# Patient Record
Sex: Female | Born: 2000 | Race: Black or African American | Hispanic: No | Marital: Single | Smoking: Current every day smoker
Health system: Southern US, Community
[De-identification: ages and names within clinical notes are randomized; demographics above are authoritative.]

## PROBLEM LIST (undated history)

## (undated) DIAGNOSIS — F419 Anxiety disorder, unspecified: Secondary | ICD-10-CM

## (undated) DIAGNOSIS — R002 Palpitations: Secondary | ICD-10-CM

## (undated) HISTORY — PX: FOOT SURGERY: SHX648

## (undated) HISTORY — DX: Palpitations: R00.2

---
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
## 2020-09-09 ENCOUNTER — Emergency Department (HOSPITAL_BASED_OUTPATIENT_CLINIC_OR_DEPARTMENT_OTHER)
Admission: EM | Admit: 2020-09-09 | Discharge: 2020-09-09 | Disposition: A | Discharge status: Home / Self Care | Payer: No Typology Code available for payment source | Attending: Emergency Medicine | Admitting: Emergency Medicine

## 2020-09-09 ENCOUNTER — Other Ambulatory Visit: Payer: Self-pay

## 2020-09-09 ENCOUNTER — Emergency Department (HOSPITAL_BASED_OUTPATIENT_CLINIC_OR_DEPARTMENT_OTHER)
Admission: RE | Admit: 2020-09-09 | Discharge: 2020-09-09 | Disposition: A | Discharge status: Home / Self Care | Payer: No Typology Code available for payment source | Source: Ambulatory Visit | Attending: Emergency Medicine | Admitting: Emergency Medicine

## 2020-09-09 ENCOUNTER — Encounter (HOSPITAL_BASED_OUTPATIENT_CLINIC_OR_DEPARTMENT_OTHER): Payer: Self-pay

## 2020-09-09 DIAGNOSIS — R102 Pelvic and perineal pain: Secondary | ICD-10-CM | POA: Insufficient documentation

## 2020-09-09 LAB — URINALYSIS, ROUTINE W REFLEX MICROSCOPIC
Bilirubin Urine: NEGATIVE
Glucose, UA: NEGATIVE mg/dL
Hgb urine dipstick: NEGATIVE
Ketones, ur: NEGATIVE mg/dL
Leukocytes,Ua: NEGATIVE
Nitrite: NEGATIVE
Protein, ur: NEGATIVE mg/dL
Specific Gravity, Urine: >1.03 — ABNORMAL HIGH (ref 1.005–1.030)
pH: 5.5 (ref 5.0–8.0)

## 2020-09-09 LAB — WET PREP, GENITAL
Sperm: NONE SEEN
Trich, Wet Prep: NONE SEEN
Yeast Wet Prep HPF POC: NONE SEEN

## 2020-09-09 LAB — PREGNANCY, URINE: Preg Test, Ur: NEGATIVE

## 2020-09-09 MED ORDER — ONDANSETRON 4 MG PO TBDP
8.0000 mg | ORAL_TABLET | Freq: Once | ORAL | Status: AC
  Administered 2020-09-09: 8 mg via ORAL
  Filled 2020-09-09: qty 2

## 2020-09-09 MED ORDER — ONDANSETRON 4 MG PO TBDP: 4.0000 mg | ORAL_TABLET | Freq: Once | ORAL | Status: DC

## 2020-09-09 NOTE — ED Triage Notes (Signed)
C/o lower abd/pelvis pain radiating to back x3 months. (+) nausea (-) vomit/diarrhea (-) dysuria (-) fever/chills

## 2020-09-09 NOTE — ED Provider Notes (Signed)
[facility] Provider Note: Samantha Dell, MD, FACEP  CSN: [id number] MRN: [id number] ARRIVAL: 09/09/20 at 0325 ROOM: [facility]   CHIEF COMPLAINT  Abdominal Pain   HISTORY OF PRESENT ILLNESS  09/09/20 3:50 AM Samantha Buckley is a 20 y.o. female with pelvic pain radiating to her back for the past 3 months.  She rates her pain as a 9 out of 10, stabbing in nature.  It is worse with ambulation.  She denies any vaginal bleeding or discharge.  She denies any dysuria or hematuria.  She denies fever or chills.  She denies vomiting or diarrhea but has had nausea.  She came in tonight because her symptoms worsened recently.   No past medical history on file.  No past surgical history on file.  No family history on file.  Social History   Tobacco Use  . Smoking status: Never Smoker  . Smokeless tobacco: Never Used  Vaping Use  . Vaping Use: Never used  Substance Use Topics  . Alcohol use: Yes  . Drug use: Never    Prior to Admission medications   Not on File    Allergies Penicillins   REVIEW OF SYSTEMS  Negative except as noted here or in the History of Present Illness.   PHYSICAL EXAMINATION  Initial Vital Signs Blood pressure 118/70, pulse 100, temperature 98 F (36.7 C), resp. rate 16, height 5\' 5"  (1.651 m), weight 90.7 kg, last menstrual period 08/20/2020, SpO2 100 %.  Examination General: Well-developed, well-nourished female in no acute distress; appearance consistent with age of record HENT: normocephalic; atraumatic Eyes: Normal appearance Neck: supple Heart: regular rate and rhythm Lungs: clear to auscultation bilaterally Abdomen: soft; nondistended; mild suprapubic tenderness; bowel sounds present GU: Normal external genitalia; no vaginal bleeding; no vaginal discharge; no cervical erythema; enlarged, tender uterus Extremities: No deformity; full range of motion; pulses normal Neurologic: Awake, alert and oriented; motor function intact in  all extremities and symmetric; no facial droop Skin: Warm and dry Psychiatric: Normal mood and affect   RESULTS  Summary of this visit's results, reviewed and interpreted by myself:   EKG Interpretation  Date/Time:    Ventricular Rate:    PR Interval:    QRS Duration:   QT Interval:    QTC Calculation:   R Axis:     Text Interpretation:        Laboratory Studies: Results for orders placed or performed during the hospital encounter of 09/09/20 (from the past 24 hour(s))  Urinalysis, Routine w reflex microscopic     Status: Abnormal   Collection Time: 09/09/20  3:59 AM  Result Value Ref Range   Color, Urine YELLOW YELLOW   APPearance CLEAR CLEAR   Specific Gravity, Urine >1.030 (H) 1.005 - 1.030   pH 5.5 5.0 - 8.0   Glucose, UA NEGATIVE NEGATIVE mg/dL   Hgb urine dipstick NEGATIVE NEGATIVE   Bilirubin Urine NEGATIVE NEGATIVE   Ketones, ur NEGATIVE NEGATIVE mg/dL   Protein, ur NEGATIVE NEGATIVE mg/dL   Nitrite NEGATIVE NEGATIVE   Leukocytes,Ua NEGATIVE NEGATIVE  Pregnancy, urine     Status: None   Collection Time: 09/09/20  3:59 AM  Result Value Ref Range   Preg Test, Ur NEGATIVE NEGATIVE  Wet prep, genital     Status: Abnormal   Collection Time: 09/09/20  3:59 AM   Specimen: Cervical/Vaginal swab; Genital  Result Value Ref Range   Yeast Wet Prep HPF POC NONE SEEN NONE SEEN   Trich, Wet Prep NONE SEEN  NONE SEEN   Clue Cells Wet Prep HPF POC PRESENT (A) NONE SEEN   WBC, Wet Prep HPF POC MANY (A) NONE SEEN   Sperm NONE SEEN    Imaging Studies: No results found.  ED COURSE and MDM  Nursing notes, initial and subsequent vitals signs, including pulse oximetry, reviewed and interpreted by myself.  Vitals:   09/09/20 0333 09/09/20 0334  BP: 118/70   Pulse: 100   Resp: 16   Temp: 98 F (36.7 C)   SpO2: 100%   Weight:  90.7 kg  Height:  5\' 5"  (1.651 m)   Medications  ondansetron (ZOFRAN-ODT) disintegrating tablet 8 mg (8 mg Oral Given 09/09/20 0350)   4:32  AM Enlarged, tender uterus on exam is concerning for uterine fibroids.  We will have patient return for pelvic ultrasound.   PROCEDURES  Procedures   ED DIAGNOSES     ICD-10-CM   1. Pelvic pain in female  R10.2        Jayse Hodkinson, MD 09/09/20 [phone]

## 2020-09-10 LAB — GC/CHLAMYDIA PROBE AMP (~~LOC~~) NOT AT ARMC
Chlamydia: NEGATIVE
Comment: NEGATIVE
Comment: NORMAL
Neisseria Gonorrhea: NEGATIVE

## 2020-12-09 NOTE — Addendum Note (Signed)
Encounter addended by: Novella Olive on: 12/09/2020 10:46 AM  Actions taken: Letter saved

## 2021-01-01 ENCOUNTER — Encounter (HOSPITAL_BASED_OUTPATIENT_CLINIC_OR_DEPARTMENT_OTHER): Payer: Self-pay

## 2021-01-01 ENCOUNTER — Encounter (HOSPITAL_BASED_OUTPATIENT_CLINIC_OR_DEPARTMENT_OTHER): Payer: Self-pay | Admitting: Emergency Medicine

## 2021-01-01 ENCOUNTER — Other Ambulatory Visit: Payer: Self-pay

## 2021-01-01 ENCOUNTER — Other Ambulatory Visit (HOSPITAL_BASED_OUTPATIENT_CLINIC_OR_DEPARTMENT_OTHER): Payer: Self-pay | Admitting: Emergency Medicine

## 2021-01-01 ENCOUNTER — Emergency Department (HOSPITAL_BASED_OUTPATIENT_CLINIC_OR_DEPARTMENT_OTHER)
Admission: EM | Admit: 2021-01-01 | Discharge: 2021-01-01 | Disposition: A | Discharge status: Home / Self Care | Payer: No Typology Code available for payment source | Attending: Emergency Medicine | Admitting: Emergency Medicine

## 2021-01-01 ENCOUNTER — Ambulatory Visit (HOSPITAL_BASED_OUTPATIENT_CLINIC_OR_DEPARTMENT_OTHER): Admit: 2021-01-01 | Payer: No Typology Code available for payment source

## 2021-01-01 DIAGNOSIS — N760 Acute vaginitis: Secondary | ICD-10-CM | POA: Diagnosis not present

## 2021-01-01 DIAGNOSIS — R102 Pelvic and perineal pain: Secondary | ICD-10-CM

## 2021-01-01 DIAGNOSIS — R1030 Lower abdominal pain, unspecified: Secondary | ICD-10-CM | POA: Insufficient documentation

## 2021-01-01 DIAGNOSIS — B9689 Other specified bacterial agents as the cause of diseases classified elsewhere: Secondary | ICD-10-CM | POA: Diagnosis not present

## 2021-01-01 DIAGNOSIS — R110 Nausea: Secondary | ICD-10-CM | POA: Insufficient documentation

## 2021-01-01 LAB — URINALYSIS, ROUTINE W REFLEX MICROSCOPIC
Bilirubin Urine: NEGATIVE
Glucose, UA: NEGATIVE mg/dL
Ketones, ur: NEGATIVE mg/dL
Leukocytes,Ua: NEGATIVE
Nitrite: NEGATIVE
Protein, ur: NEGATIVE mg/dL
Specific Gravity, Urine: 1.015 (ref 1.005–1.030)
pH: 6 (ref 5.0–8.0)

## 2021-01-01 LAB — CBC WITH DIFFERENTIAL/PLATELET
Abs Immature Granulocytes: 0.05 10*3/uL (ref 0.00–0.07)
Basophils Absolute: 0 10*3/uL (ref 0.0–0.1)
Basophils Relative: 0 %
Eosinophils Absolute: 0.2 10*3/uL (ref 0.0–0.5)
Eosinophils Relative: 3 %
HCT: 39.8 % (ref 36.0–46.0)
Hemoglobin: 12.8 g/dL (ref 12.0–15.0)
Immature Granulocytes: 1 %
Lymphocytes Relative: 30 %
Lymphs Abs: 2.7 10*3/uL (ref 0.7–4.0)
MCH: 27.2 pg (ref 26.0–34.0)
MCHC: 32.2 g/dL (ref 30.0–36.0)
MCV: 84.7 fL (ref 80.0–100.0)
Monocytes Absolute: 0.8 10*3/uL (ref 0.1–1.0)
Monocytes Relative: 8 %
Neutro Abs: 5.2 10*3/uL (ref 1.7–7.7)
Neutrophils Relative %: 58 %
Platelets: 247 10*3/uL (ref 150–400)
RBC: 4.7 MIL/uL (ref 3.87–5.11)
RDW: 12.5 % (ref 11.5–15.5)
WBC: 8.9 10*3/uL (ref 4.0–10.5)
nRBC: 0 % (ref 0.0–0.2)

## 2021-01-01 LAB — COMPREHENSIVE METABOLIC PANEL
ALT: 13 U/L (ref 0–44)
AST: 17 U/L (ref 15–41)
Albumin: 4 g/dL (ref 3.5–5.0)
Alkaline Phosphatase: 62 U/L (ref 38–126)
Anion gap: 5 (ref 5–15)
BUN: 12 mg/dL (ref 6–20)
CO2: 28 mmol/L (ref 22–32)
Calcium: 8.9 mg/dL (ref 8.9–10.3)
Chloride: 104 mmol/L (ref 98–111)
Creatinine, Ser: 0.96 mg/dL (ref 0.44–1.00)
GFR, Estimated: >60 mL/min (ref >60)
Glucose, Bld: 86 mg/dL (ref 70–99)
Potassium: 3.2 mmol/L — ABNORMAL LOW (ref 3.5–5.1)
Sodium: 137 mmol/L (ref 135–145)
Total Bilirubin: 0.3 mg/dL (ref 0.3–1.2)
Total Protein: 7.2 g/dL (ref 6.5–8.1)

## 2021-01-01 LAB — PREGNANCY, URINE: Preg Test, Ur: NEGATIVE

## 2021-01-01 LAB — WET PREP, GENITAL
Sperm: NONE SEEN
Trich, Wet Prep: NONE SEEN
Yeast Wet Prep HPF POC: NONE SEEN

## 2021-01-01 LAB — URINALYSIS, MICROSCOPIC (REFLEX)

## 2021-01-01 MED ORDER — ONDANSETRON HCL 4 MG/2ML IJ SOLN
4.0000 mg | Freq: Once | INTRAMUSCULAR | Status: AC
  Administered 2021-01-01: 4 mg via INTRAVENOUS
  Filled 2021-01-01: qty 2

## 2021-01-01 MED ORDER — ONDANSETRON 4 MG PO TBDP: 4.0000 mg | ORAL_TABLET | Freq: Three times a day (TID) | ORAL | 0 refills | Status: DC | PRN

## 2021-01-01 MED ORDER — KETOROLAC TROMETHAMINE 15 MG/ML IJ SOLN
15.0000 mg | Freq: Once | INTRAMUSCULAR | Status: AC
  Administered 2021-01-01: 15 mg via INTRAVENOUS
  Filled 2021-01-01: qty 1

## 2021-01-01 MED ORDER — METRONIDAZOLE 500 MG PO TABS: 500.0000 mg | ORAL_TABLET | Freq: Two times a day (BID) | ORAL | 0 refills | Status: DC

## 2021-01-01 NOTE — ED Triage Notes (Addendum)
Pt presents to ED POV. Pt c/o severe menstrual cramps and nausea. Pt reports that OTC usually do not provide relief. NAD

## 2021-01-01 NOTE — ED Notes (Signed)
Discharge instructions discussed with pt. Pt verbalized understanding. Pt stable and ambulatory.  °

## 2021-01-01 NOTE — ED Provider Notes (Signed)
[facility] EMERGENCY DEPARTMENT Provider Note   CSN: [id number] Arrival date & time: 01/01/21  0059     History Chief Complaint  Patient presents with   Abdominal Cramping    Samantha Buckley is a 20 y.o. female.  The history is provided by the patient.  Abdominal Cramping Samantha Buckley is a 20 y.o. female who presents to the Emergency Department complaining of abdominal cramping.  She presents to the ED complaining of severe pelvic/lower abdominal cramping that started earlier tonight.  Sxs started suddenly and are constant, nonradiating.  Has associated nausea.  No vomiting, diarrhea, dysuria, vaginal discharge.  No prior similar sxs.  Currently on her cycle, started two days ago.  Usually has painful cycles and this is more painful than usual cycle.  No new partners.       History reviewed. No pertinent past medical history.  There are no problems to display for this patient.   History reviewed. No pertinent surgical history.   OB History   No obstetric history on file.     History reviewed. No pertinent family history.  Social History   Tobacco Use   Smoking status: Never   Smokeless tobacco: Never  Vaping Use   Vaping Use: Never used  Substance Use Topics   Alcohol use: Yes   Drug use: Never    Home Medications Prior to Admission medications   Medication Sig Start Date End Date Taking? Authorizing Provider  metroNIDAZOLE (FLAGYL) 500 MG tablet Take 1 tablet (500 mg total) by mouth 2 (two) times daily. 01/01/21  Yes Tilden Fossa, MD  ondansetron (ZOFRAN ODT) 4 MG disintegrating tablet Take 1 tablet (4 mg total) by mouth every 8 (eight) hours as needed for nausea or vomiting. 01/01/21  Yes Tilden Fossa, MD    Allergies    Penicillins  Review of Systems   Review of Systems  All other systems reviewed and are negative.  Physical Exam Updated Vital Signs BP 131/85   Pulse (!) 59   Temp 97.7 F (36.5 C) (Oral)   Resp 18   Ht 5\' 5"   (1.651 m)   Wt 102.5 kg   LMP 12/30/2020   SpO2 100%   BMI 37.61 kg/m   Physical Exam Vitals and nursing note reviewed.  Constitutional:      Appearance: She is well-developed.  HENT:     Head: Normocephalic and atraumatic.  Cardiovascular:     Rate and Rhythm: Normal rate and regular rhythm.     Heart sounds: No murmur heard. Pulmonary:     Effort: Pulmonary effort is normal. No respiratory distress.     Breath sounds: Normal breath sounds.  Abdominal:     Palpations: Abdomen is soft.     Tenderness: There is no abdominal tenderness. There is no guarding or rebound.  Genitourinary:    Comments: Scant vaginal bleeding.  No significant discharge.  No cmt Musculoskeletal:        General: No tenderness.  Skin:    General: Skin is warm and dry.  Neurological:     Mental Status: She is alert and oriented to person, place, and time.  Psychiatric:        Behavior: Behavior normal.    ED Results / Procedures / Treatments   Labs (all labs ordered are listed, but only abnormal results are displayed) Labs Reviewed  WET PREP, GENITAL - Abnormal; Notable for the following components:      Result Value   Clue Cells Wet Prep HPF  POC PRESENT (*)    WBC, Wet Prep HPF POC FEW (*)    All other components within normal limits  COMPREHENSIVE METABOLIC PANEL - Abnormal; Notable for the following components:   Potassium 3.2 (*)    All other components within normal limits  URINALYSIS, ROUTINE W REFLEX MICROSCOPIC - Abnormal; Notable for the following components:   Hgb urine dipstick SMALL (*)    All other components within normal limits  URINALYSIS, MICROSCOPIC (REFLEX) - Abnormal; Notable for the following components:   Bacteria, UA RARE (*)    All other components within normal limits  CBC WITH DIFFERENTIAL/PLATELET  PREGNANCY, URINE  GC/CHLAMYDIA PROBE AMP ([place]) NOT AT [facility]    EKG None  Radiology No results found.  Procedures Procedures   Medications Ordered in  ED Medications  ketorolac (TORADOL) 15 MG/ML injection 15 mg (15 mg Intravenous Given 01/01/21 0220)  ondansetron (ZOFRAN) injection 4 mg (4 mg Intravenous Given 01/01/21 0227)    ED Course  I have reviewed the triage vital signs and the nursing notes.  Pertinent labs & imaging results that were available during my care of the patient were reviewed by me and considered in my medical decision making (see chart for details).    MDM Rules/Calculators/A&P                          patient here for evaluation of pelvic pain that occurred earlier today. She is non-toxic appearing on evaluation. She has mild pelvic tenderness on examination without peritoneal findings. No significant bleeding on evaluation here. Low index of suspicion for torsion, appendicitis. What prep is significant for BV. Given her pain do recommend that she has ultrasound to rule out new ovarian cyst. Doubt to tubo-ovarian abscess, PID. Ultrasound is not currently available at this time. Plan to discharge home with over-the-counter analgesics, Flagyl for BV. Plan to have her return later today for formal pelvic ultrasound to evaluate for cyst. Outpatient follow-up and return precautions discussed.  Final Clinical Impression(s) / ED Diagnoses Final diagnoses:  BV (bacterial vaginosis)  Pelvic pain in female    Rx / DC Orders ED Discharge Orders          Ordered    metroNIDAZOLE (FLAGYL) 500 MG tablet  2 times daily        01/01/21 0357    ondansetron (ZOFRAN ODT) 4 MG disintegrating tablet  Every 8 hours PRN        01/01/21 0357             Tilden Fossa, MD 01/01/21 [phone]

## 2021-01-02 LAB — GC/CHLAMYDIA PROBE AMP (~~LOC~~) NOT AT ARMC
Chlamydia: NEGATIVE
Comment: NEGATIVE
Comment: NORMAL
Neisseria Gonorrhea: NEGATIVE

## 2021-06-04 ENCOUNTER — Emergency Department (HOSPITAL_BASED_OUTPATIENT_CLINIC_OR_DEPARTMENT_OTHER): Payer: No Typology Code available for payment source

## 2021-06-04 ENCOUNTER — Emergency Department (HOSPITAL_BASED_OUTPATIENT_CLINIC_OR_DEPARTMENT_OTHER)
Admission: EM | Admit: 2021-06-04 | Discharge: 2021-06-04 | Disposition: A | Discharge status: Home / Self Care | Payer: No Typology Code available for payment source | Attending: Emergency Medicine | Admitting: Emergency Medicine

## 2021-06-04 ENCOUNTER — Encounter (HOSPITAL_BASED_OUTPATIENT_CLINIC_OR_DEPARTMENT_OTHER): Payer: Self-pay | Admitting: *Deleted

## 2021-06-04 ENCOUNTER — Other Ambulatory Visit: Payer: Self-pay

## 2021-06-04 DIAGNOSIS — R1084 Generalized abdominal pain: Secondary | ICD-10-CM | POA: Diagnosis present

## 2021-06-04 DIAGNOSIS — R112 Nausea with vomiting, unspecified: Secondary | ICD-10-CM | POA: Insufficient documentation

## 2021-06-04 LAB — URINALYSIS, ROUTINE W REFLEX MICROSCOPIC
Bilirubin Urine: NEGATIVE
Glucose, UA: NEGATIVE mg/dL
Ketones, ur: NEGATIVE mg/dL
Leukocytes,Ua: NEGATIVE
Nitrite: NEGATIVE
Protein, ur: NEGATIVE mg/dL
Specific Gravity, Urine: 1.025 (ref 1.005–1.030)
pH: 5.5 (ref 5.0–8.0)

## 2021-06-04 LAB — URINALYSIS, MICROSCOPIC (REFLEX)

## 2021-06-04 LAB — COMPREHENSIVE METABOLIC PANEL
ALT: 16 U/L (ref 0–44)
AST: 17 U/L (ref 15–41)
Albumin: 4.2 g/dL (ref 3.5–5.0)
Alkaline Phosphatase: 64 U/L (ref 38–126)
Anion gap: 10 (ref 5–15)
BUN: 11 mg/dL (ref 6–20)
CO2: 25 mmol/L (ref 22–32)
Calcium: 9.4 mg/dL (ref 8.9–10.3)
Chloride: 99 mmol/L (ref 98–111)
Creatinine, Ser: 0.68 mg/dL (ref 0.44–1.00)
GFR, Estimated: >60 mL/min (ref >60)
Glucose, Bld: 99 mg/dL (ref 70–99)
Potassium: 4.3 mmol/L (ref 3.5–5.1)
Sodium: 134 mmol/L — ABNORMAL LOW (ref 135–145)
Total Bilirubin: 0.5 mg/dL (ref 0.3–1.2)
Total Protein: 7.8 g/dL (ref 6.5–8.1)

## 2021-06-04 LAB — CBC
HCT: 44.7 % (ref 36.0–46.0)
Hemoglobin: 14.1 g/dL (ref 12.0–15.0)
MCH: 26.8 pg (ref 26.0–34.0)
MCHC: 31.5 g/dL (ref 30.0–36.0)
MCV: 84.8 fL (ref 80.0–100.0)
Platelets: 325 10*3/uL (ref 150–400)
RBC: 5.27 MIL/uL — ABNORMAL HIGH (ref 3.87–5.11)
RDW: 13.2 % (ref 11.5–15.5)
WBC: 8.3 10*3/uL (ref 4.0–10.5)
nRBC: 0 % (ref 0.0–0.2)

## 2021-06-04 LAB — LIPASE, BLOOD: Lipase: 39 U/L (ref 11–51)

## 2021-06-04 LAB — PREGNANCY, URINE: Preg Test, Ur: NEGATIVE

## 2021-06-04 MED ORDER — IOHEXOL 300 MG/ML  SOLN
100.0000 mL | Freq: Once | INTRAMUSCULAR | Status: AC | PRN
  Administered 2021-06-04: 100 mL via INTRAVENOUS

## 2021-06-04 MED ORDER — ONDANSETRON 4 MG PO TBDP: 4.0000 mg | ORAL_TABLET | Freq: Three times a day (TID) | ORAL | 0 refills | Status: DC | PRN

## 2021-06-04 MED ORDER — ONDANSETRON 4 MG PO TBDP
8.0000 mg | ORAL_TABLET | Freq: Once | ORAL | Status: AC
  Administered 2021-06-04: 8 mg via ORAL
  Filled 2021-06-04: qty 2

## 2021-06-04 MED ORDER — SODIUM CHLORIDE 0.9 % IV BOLUS
1000.0000 mL | Freq: Once | INTRAVENOUS | Status: AC
  Administered 2021-06-04: 1000 mL via INTRAVENOUS

## 2021-06-04 MED ORDER — MORPHINE SULFATE (PF) 4 MG/ML IV SOLN
4.0000 mg | Freq: Once | INTRAVENOUS | Status: AC
  Administered 2021-06-04: 4 mg via INTRAVENOUS
  Filled 2021-06-04: qty 1

## 2021-06-04 MED ORDER — PANTOPRAZOLE SODIUM 20 MG PO TBEC: 20.0000 mg | DELAYED_RELEASE_TABLET | Freq: Every day | ORAL | 2 refills | Status: AC

## 2021-06-04 MED ORDER — ONDANSETRON HCL 4 MG/2ML IJ SOLN
4.0000 mg | Freq: Once | INTRAMUSCULAR | Status: AC
  Administered 2021-06-04: 4 mg via INTRAVENOUS
  Filled 2021-06-04: qty 2

## 2021-06-04 NOTE — ED Notes (Signed)
Patient transported to CT 

## 2021-06-04 NOTE — ED Notes (Signed)
Pt got nauseous and felt syncopal when sitting on the side of the bed getting dressed. New orders for ODT zofran placed

## 2021-06-04 NOTE — ED Provider Notes (Signed)
[facility] EMERGENCY DEPARTMENT Provider Note   CSN: [id number] Arrival date & time: 06/04/21  1825     History  Chief Complaint  Patient presents with   Abdominal Pain    Samantha DameQuintessa Buckley is a 21 y.o. female.  She is here with worsening of her chronic abdominal pain nausea and vomiting that is been going on for months.  She says it is on and off.  She is seen in the ER and gynecology and "nobody has been able to figure out what the problem is."  She rates the pain as sharp and severe, involves her entire abdomen and causes her to vomit.  She had a little bit of diarrhea today but this is unusual.  No urinary symptoms vaginal bleeding or discharge.  She has no concerns for STIs.  No fevers or chills.  No chest pain.  She does not take any medications.  The history is provided by the patient.  Abdominal Pain Pain location:  Generalized Pain quality: stabbing   Pain severity:  Severe Onset quality:  Gradual Duration:  1 day Timing:  Constant Progression:  Unchanged Chronicity:  Recurrent Context: not trauma   Relieved by:  Nothing Worsened by:  Nothing Ineffective treatments:  None tried Associated symptoms: diarrhea, nausea, shortness of breath (sometimes) and vomiting   Associated symptoms: no chest pain, no constipation, no cough, no dysuria, no fever, no hematemesis, no hematochezia, no hematuria, no sore throat, no vaginal bleeding and no vaginal discharge       Home Medications Prior to Admission medications   Medication Sig Start Date End Date Taking? Authorizing Provider  metroNIDAZOLE (FLAGYL) 500 MG tablet Take 1 tablet (500 mg total) by mouth 2 (two) times daily. 01/01/21   Tilden Fossaees, Elizabeth, MD  ondansetron (ZOFRAN ODT) 4 MG disintegrating tablet Take 1 tablet (4 mg total) by mouth every 8 (eight) hours as needed for nausea or vomiting. 01/01/21   Tilden Fossaees, Elizabeth, MD      Allergies    Penicillins    Review of Systems   Review of Systems  Constitutional:   Negative for fever.  HENT:  Negative for sore throat.   Eyes:  Negative for visual disturbance.  Respiratory:  Positive for shortness of breath (sometimes). Negative for cough.   Cardiovascular:  Negative for chest pain.  Gastrointestinal:  Positive for abdominal pain, diarrhea, nausea and vomiting. Negative for constipation, hematemesis and hematochezia.  Genitourinary:  Negative for dysuria, hematuria, vaginal bleeding and vaginal discharge.  Musculoskeletal:  Negative for back pain.  Skin:  Negative for rash.  Neurological:  Negative for headaches.   Physical Exam Updated Vital Signs BP 132/85 (BP Location: Right Arm)    Pulse 69    Temp 98.5 F (36.9 C) (Oral)    Resp 18    Ht 5\' 5"  (1.651 m)    Wt 102.5 kg    SpO2 100%    BMI 37.60 kg/m  Physical Exam Vitals and nursing note reviewed.  Constitutional:      General: She is not in acute distress.    Appearance: Normal appearance. She is well-developed.  HENT:     Head: Normocephalic and atraumatic.  Eyes:     Conjunctiva/sclera: Conjunctivae normal.  Cardiovascular:     Rate and Rhythm: Normal rate and regular rhythm.     Heart sounds: No murmur heard. Pulmonary:     Effort: Pulmonary effort is normal. No respiratory distress.     Breath sounds: Normal breath sounds.  Abdominal:  Palpations: Abdomen is soft.     Tenderness: There is generalized abdominal tenderness. There is no guarding or rebound.  Musculoskeletal:        General: No swelling.     Cervical back: Neck supple.  Skin:    General: Skin is warm and dry.     Capillary Refill: Capillary refill takes less than 2 seconds.  Neurological:     General: No focal deficit present.     Mental Status: She is alert.    ED Results / Procedures / Treatments   Labs (all labs ordered are listed, but only abnormal results are displayed) Labs Reviewed  COMPREHENSIVE METABOLIC PANEL - Abnormal; Notable for the following components:      Result Value   Sodium 134 (*)     All other components within normal limits  CBC - Abnormal; Notable for the following components:   RBC 5.27 (*)    All other components within normal limits  URINALYSIS, ROUTINE W REFLEX MICROSCOPIC - Abnormal; Notable for the following components:   APPearance HAZY (*)    Hgb urine dipstick SMALL (*)    All other components within normal limits  URINALYSIS, MICROSCOPIC (REFLEX) - Abnormal; Notable for the following components:   Bacteria, UA FEW (*)    All other components within normal limits  LIPASE, BLOOD  PREGNANCY, URINE    EKG None  Radiology CT Abdomen Pelvis W Contrast  Result Date: 06/04/2021 CLINICAL DATA:  Abdominal pain, nausea EXAM: CT ABDOMEN AND PELVIS WITH CONTRAST TECHNIQUE: Multidetector CT imaging of the abdomen and pelvis was performed using the standard protocol following bolus administration of intravenous contrast. RADIATION DOSE REDUCTION: This exam was performed according to the departmental dose-optimization program which includes automated exposure control, adjustment of the mA and/or kV according to patient size and/or use of iterative reconstruction technique. CONTRAST:  139mL OMNIPAQUE IOHEXOL 300 MG/ML  SOLN COMPARISON:  None. FINDINGS: Lower chest: No acute pleural or parenchymal lung disease. Hepatobiliary: No focal liver abnormality is seen. No gallstones, gallbladder wall thickening, or biliary dilatation. Pancreas: Unremarkable. No pancreatic ductal dilatation or surrounding inflammatory changes. Spleen: Normal in size without focal abnormality. Adrenals/Urinary Tract: Adrenal glands are unremarkable. Kidneys are normal, without renal calculi, focal lesion, or hydronephrosis. Bladder is unremarkable. Stomach/Bowel: No bowel obstruction or ileus. Normal appendix right lower quadrant. No bowel wall thickening or inflammatory change. Vascular/Lymphatic: No significant vascular findings are present. No enlarged abdominal or pelvic lymph nodes. Reproductive:  Uterus and bilateral adnexa are unremarkable. Other: No free fluid or free gas.  No abdominal wall hernia. Musculoskeletal: No acute or destructive bony lesions. Reconstructed images demonstrate no additional findings. IMPRESSION: 1. No acute intra-abdominal or intrapelvic process. Electronically Signed   By: Randa Ngo M.D.   On: 06/04/2021 21:20    Procedures Procedures    Medications Ordered in ED Medications  sodium chloride 0.9 % bolus 1,000 mL (0 mLs Intravenous Stopped 06/04/21 2236)  morphine (PF) 4 MG/ML injection 4 mg (4 mg Intravenous Given 06/04/21 2053)  ondansetron (ZOFRAN) injection 4 mg (4 mg Intravenous Given 06/04/21 2047)  iohexol (OMNIPAQUE) 300 MG/ML solution 100 mL (100 mLs Intravenous Contrast Given 06/04/21 2056)  ondansetron (ZOFRAN-ODT) disintegrating tablet 8 mg (8 mg Oral Given 06/04/21 2257)    ED Course/ Medical Decision Making/ A&P Clinical Course as of 06/05/21 0857  Tue Jun 04, 2021  2227 Patient states pain has resolved and nausea is improved.  Reviewed work-up with her.  We will put in a referral  to GI it sounds like this has been a longstanding problem.  We will put on PPI and Zofran. [MB]    Clinical Course User Index [MB] Hayden Rasmussen, MD                           Medical Decision Making Amount and/or Complexity of Data Reviewed Labs: ordered. Radiology: ordered.  Risk Prescription drug management.  This patient complains of generalized sharp abdominal pain, nausea and vomiting diarrhea; this involves an extensive number of treatment Options and is a complaint that carries with it a high risk of complications and Morbidity. The differential includes gastroenteritis, colitis, diverticulitis, gastritis, peptic ulcer disease, obstruction, IBD  I ordered, reviewed and interpreted labs, which included CBC with normal white count normal hemoglobin, chemistries and LFTs normal, urinalysis without clear signs of infection pregnancy test negative I  ordered medication IV fluids pain medication nausea medication with improvement in her symptoms I ordered imaging studies which included CT abdomen and pelvis and I independently    visualized and interpreted imaging which showed no acute findings Additional history obtained from patient's companion Previous records obtained and reviewed in epic, I do not see significant work-up here or in Care Everywhere.  Did have pelvic ultrasound in past.  After the interventions stated above, I reevaluated the patient and found patient's abdomen to remain benign and vitals stable per tensive.  Symptomatically improved.  Will place referral for GI follow-up.  Return instructions discussed no indications for admission at this time          Final Clinical Impression(s) / ED Diagnoses Final diagnoses:  Generalized abdominal pain  Nausea and vomiting, unspecified vomiting type    Rx / DC Orders ED Discharge Orders          Ordered    Ambulatory referral to Gastroenterology        06/04/21 2230    pantoprazole (PROTONIX) 20 MG tablet  Daily        06/04/21 2230    ondansetron (ZOFRAN ODT) 4 MG disintegrating tablet  Every 8 hours PRN        06/04/21 2230              Hayden Rasmussen, MD 06/05/21 [phone]

## 2021-06-04 NOTE — ED Triage Notes (Signed)
Abdominal pain and nausea for months. Has been evaluated for the pain without a cause found.

## 2021-06-04 NOTE — ED Notes (Signed)
Tolerating crackers and water.

## 2021-06-04 NOTE — Discharge Instructions (Signed)
You were seen in the emergency department for recurrent nausea vomiting and abdominal pain.  You had lab work urinalysis and a CAT scan of your abdomen and pelvis that did not show an obvious explanation for your symptoms.  We are putting in a referral to GI for you.  We also prescribing you some acid medication and nausea medication.  Please start with clear liquid diet and advance as tolerated.  Return to the emergency department if any worsening or concerning symptoms

## 2021-06-06 ENCOUNTER — Emergency Department (HOSPITAL_COMMUNITY)
Admission: EM | Admit: 2021-06-06 | Discharge: 2021-06-06 | Disposition: A | Discharge status: Home / Self Care | Payer: No Typology Code available for payment source | Attending: Emergency Medicine | Admitting: Emergency Medicine

## 2021-06-06 DIAGNOSIS — R112 Nausea with vomiting, unspecified: Secondary | ICD-10-CM | POA: Insufficient documentation

## 2021-06-06 DIAGNOSIS — R1030 Lower abdominal pain, unspecified: Secondary | ICD-10-CM | POA: Diagnosis not present

## 2021-06-06 DIAGNOSIS — R197 Diarrhea, unspecified: Secondary | ICD-10-CM | POA: Insufficient documentation

## 2021-06-06 DIAGNOSIS — E876 Hypokalemia: Secondary | ICD-10-CM | POA: Diagnosis not present

## 2021-06-06 LAB — BASIC METABOLIC PANEL
Anion gap: 6 (ref 5–15)
BUN: 8 mg/dL (ref 6–20)
CO2: 26 mmol/L (ref 22–32)
Calcium: 8.9 mg/dL (ref 8.9–10.3)
Chloride: 103 mmol/L (ref 98–111)
Creatinine, Ser: 0.64 mg/dL (ref 0.44–1.00)
GFR, Estimated: >60 mL/min (ref >60)
Glucose, Bld: 101 mg/dL — ABNORMAL HIGH (ref 70–99)
Potassium: 3.3 mmol/L — ABNORMAL LOW (ref 3.5–5.1)
Sodium: 135 mmol/L (ref 135–145)

## 2021-06-06 LAB — URINALYSIS, ROUTINE W REFLEX MICROSCOPIC
Bilirubin Urine: NEGATIVE
Glucose, UA: NEGATIVE mg/dL
Ketones, ur: 5 mg/dL — AB
Leukocytes,Ua: NEGATIVE
Nitrite: NEGATIVE
Protein, ur: NEGATIVE mg/dL
Specific Gravity, Urine: 1.013 (ref 1.005–1.030)
pH: 5 (ref 5.0–8.0)

## 2021-06-06 LAB — HEPATIC FUNCTION PANEL
ALT: 16 U/L (ref 0–44)
AST: 17 U/L (ref 15–41)
Albumin: 3.9 g/dL (ref 3.5–5.0)
Alkaline Phosphatase: 64 U/L (ref 38–126)
Bilirubin, Direct: <0.1 mg/dL (ref 0.0–0.2)
Total Bilirubin: 0.3 mg/dL (ref 0.3–1.2)
Total Protein: 7.4 g/dL (ref 6.5–8.1)

## 2021-06-06 LAB — CBC
HCT: 41.5 % (ref 36.0–46.0)
Hemoglobin: 13.2 g/dL (ref 12.0–15.0)
MCH: 27 pg (ref 26.0–34.0)
MCHC: 31.8 g/dL (ref 30.0–36.0)
MCV: 84.9 fL (ref 80.0–100.0)
Platelets: 285 10*3/uL (ref 150–400)
RBC: 4.89 MIL/uL (ref 3.87–5.11)
RDW: 13.1 % (ref 11.5–15.5)
WBC: 6.1 10*3/uL (ref 4.0–10.5)
nRBC: 0 % (ref 0.0–0.2)

## 2021-06-06 LAB — LIPASE, BLOOD: Lipase: 28 U/L (ref 11–51)

## 2021-06-06 LAB — I-STAT BETA HCG BLOOD, ED (MC, WL, AP ONLY): I-stat hCG, quantitative: <5 m[IU]/mL (ref <5)

## 2021-06-06 MED ORDER — SODIUM CHLORIDE 0.9 % IV BOLUS
1000.0000 mL | Freq: Once | INTRAVENOUS | Status: AC
  Administered 2021-06-06: 1000 mL via INTRAVENOUS

## 2021-06-06 MED ORDER — DICYCLOMINE HCL 10 MG/ML IM SOLN
20.0000 mg | Freq: Once | INTRAMUSCULAR | Status: AC
Start: 2021-06-06 — End: 2021-06-06
  Administered 2021-06-06: 20 mg via INTRAMUSCULAR
  Filled 2021-06-06: qty 2

## 2021-06-06 MED ORDER — ONDANSETRON HCL 4 MG/2ML IJ SOLN
4.0000 mg | Freq: Once | INTRAMUSCULAR | Status: AC
  Administered 2021-06-06: 4 mg via INTRAVENOUS
  Filled 2021-06-06: qty 2

## 2021-06-06 MED ORDER — DICYCLOMINE HCL 20 MG PO TABS: 20.0000 mg | ORAL_TABLET | Freq: Three times a day (TID) | ORAL | 0 refills | Status: DC | PRN

## 2021-06-06 MED ORDER — FAMOTIDINE IN NACL 20-0.9 MG/50ML-% IV SOLN
20.0000 mg | Freq: Once | INTRAVENOUS | Status: AC
  Administered 2021-06-06: 20 mg via INTRAVENOUS
  Filled 2021-06-06: qty 50

## 2021-06-06 NOTE — ED Provider Notes (Signed)
[facility] DEPT Provider Note   CSN: [id number] Arrival date & time: 06/06/21  0208     History  Chief Complaint  Patient presents with   Abdominal Pain    Samantha Buckley is a 21 y.o. female.  Samantha Buckley is a 21 y.o. female who is otherwise healthy, returns to the emergency department for evaluation of abdominal pain, nausea and vomiting.  Patient was seen in the ED 2 days ago for similar symptoms.  Patient reports over the past few months she has had frequent episodes of lower abdominal pain described as sharp and cramping associated with severe nausea and vomiting.  Occasionally patient also has some diarrhea, denies constipation.  Denies any dysuria, urinary frequency.  No vaginal bleeding or vaginal discharge.  Saw her PCP and OB/GYN for the symptoms without clear cause.  Was referred to GI when she was seen 2 days ago.  Reports that she tried taking Zofran and Protonix at home but pain returned and now she has been vomiting.  No prior abdominal surgeries.  The history is provided by the patient and medical records.      Home Medications Prior to Admission medications   Medication Sig Start Date End Date Taking? Authorizing Provider  dicyclomine (BENTYL) 20 MG tablet Take 1 tablet (20 mg total) by mouth 3 (three) times daily as needed (Abdominal pain). 06/06/21  Yes Jacqlyn Larsen, PA-C  metroNIDAZOLE (FLAGYL) 500 MG tablet Take 1 tablet (500 mg total) by mouth 2 (two) times daily. 01/01/21   Quintella Reichert, MD  ondansetron (ZOFRAN ODT) 4 MG disintegrating tablet Take 1 tablet (4 mg total) by mouth every 8 (eight) hours as needed for nausea or vomiting. 06/04/21   Hayden Rasmussen, MD  pantoprazole (PROTONIX) 20 MG tablet Take 1 tablet (20 mg total) by mouth daily. 06/04/21   Hayden Rasmussen, MD      Allergies    Penicillins    Review of Systems   Review of Systems  Constitutional:  Negative for chills and fever.  HENT: Negative.     Respiratory:  Negative for cough and shortness of breath.   Cardiovascular:  Negative for chest pain.  Gastrointestinal:  Positive for abdominal pain, diarrhea, nausea and vomiting.  Genitourinary:  Negative for dysuria, hematuria, vaginal bleeding and vaginal discharge.  Musculoskeletal:  Negative for arthralgias and myalgias.  Skin:  Negative for color change and rash.  Neurological:  Negative for dizziness, syncope and light-headedness.  All other systems reviewed and are negative.  Physical Exam Updated Vital Signs BP 126/87    Pulse 66    Temp 98.3 F (36.8 C) (Oral)    Resp 16    SpO2 100%  Physical Exam Vitals and nursing note reviewed.  Constitutional:      General: She is not in acute distress.    Appearance: Normal appearance. She is well-developed. She is not diaphoretic.  HENT:     Head: Normocephalic and atraumatic.  Eyes:     General:        Right eye: No discharge.        Left eye: No discharge.     Pupils: Pupils are equal, round, and reactive to light.  Cardiovascular:     Rate and Rhythm: Normal rate and regular rhythm.     Pulses: Normal pulses.     Heart sounds: Normal heart sounds.  Pulmonary:     Effort: Pulmonary effort is normal. No respiratory distress.     Breath  sounds: Normal breath sounds. No wheezing or rales.     Comments: Respirations equal and unlabored, patient able to speak in full sentences, lungs clear to auscultation bilaterally  Abdominal:     General: Bowel sounds are normal. There is no distension.     Palpations: Abdomen is soft. There is no mass.     Tenderness: There is no abdominal tenderness. There is no right CVA tenderness, left CVA tenderness or guarding.     Comments: Abdomen soft, nondistended, very mild lower abdominal tenderness that does not localize to one quadrant, no guarding or peritoneal signs  Musculoskeletal:        General: No deformity.     Cervical back: Neck supple.  Skin:    General: Skin is warm and dry.      Capillary Refill: Capillary refill takes less than 2 seconds.  Neurological:     Mental Status: She is alert and oriented to person, place, and time.     Coordination: Coordination normal.     Comments: Speech is clear, able to follow commands Moves extremities without ataxia, coordination intact  Psychiatric:        Mood and Affect: Mood normal.        Behavior: Behavior normal.    ED Results / Procedures / Treatments   Labs (all labs ordered are listed, but only abnormal results are displayed) Labs Reviewed  BASIC METABOLIC PANEL - Abnormal; Notable for the following components:      Result Value   Potassium 3.3 (*)    Glucose, Bld 101 (*)    All other components within normal limits  URINALYSIS, ROUTINE W REFLEX MICROSCOPIC - Abnormal; Notable for the following components:   APPearance HAZY (*)    Hgb urine dipstick SMALL (*)    Ketones, ur 5 (*)    Bacteria, UA MANY (*)    All other components within normal limits  CBC  HEPATIC FUNCTION PANEL  LIPASE, BLOOD  I-STAT BETA HCG BLOOD, ED ([facility], [facility], [facility] ONLY)    EKG None  Radiology CT Abdomen Pelvis W Contrast  Result Date: 06/04/2021 CLINICAL DATA:  Abdominal pain, nausea EXAM: CT ABDOMEN AND PELVIS WITH CONTRAST TECHNIQUE: Multidetector CT imaging of the abdomen and pelvis was performed using the standard protocol following bolus administration of intravenous contrast. RADIATION DOSE REDUCTION: This exam was performed according to the departmental dose-optimization program which includes automated exposure control, adjustment of the mA and/or kV according to patient size and/or use of iterative reconstruction technique. CONTRAST:  135mL OMNIPAQUE IOHEXOL 300 MG/ML  SOLN COMPARISON:  None. FINDINGS: Lower chest: No acute pleural or parenchymal lung disease. Hepatobiliary: No focal liver abnormality is seen. No gallstones, gallbladder wall thickening, or biliary dilatation. Pancreas: Unremarkable. No pancreatic ductal dilatation or  surrounding inflammatory changes. Spleen: Normal in size without focal abnormality. Adrenals/Urinary Tract: Adrenal glands are unremarkable. Kidneys are normal, without renal calculi, focal lesion, or hydronephrosis. Bladder is unremarkable. Stomach/Bowel: No bowel obstruction or ileus. Normal appendix right lower quadrant. No bowel wall thickening or inflammatory change. Vascular/Lymphatic: No significant vascular findings are present. No enlarged abdominal or pelvic lymph nodes. Reproductive: Uterus and bilateral adnexa are unremarkable. Other: No free fluid or free gas.  No abdominal wall hernia. Musculoskeletal: No acute or destructive bony lesions. Reconstructed images demonstrate no additional findings. IMPRESSION: 1. No acute intra-abdominal or intrapelvic process. Electronically Signed   By: Randa Ngo M.D.   On: 06/04/2021 21:20    Procedures Procedures    Medications Ordered  in ED Medications  sodium chloride 0.9 % bolus 1,000 mL (0 mLs Intravenous Stopped 06/06/21 0909)  ondansetron (ZOFRAN) injection 4 mg (4 mg Intravenous Given 06/06/21 0800)  famotidine (PEPCID) IVPB 20 mg premix (0 mg Intravenous Stopped 06/06/21 0833)  dicyclomine (BENTYL) injection 20 mg (20 mg Intramuscular Given 06/06/21 0800)    ED Course/ Medical Decision Making/ A&P                           21 y.o. female presents to the ED with complaints of lower abdominal pain, nausea, vomiting, seen for similar 2 days ago, this involves an extensive number of treatment options, and is a complaint that carries with it a high risk of complications and morbidity.  The differential diagnosis includes IBS, IBD, colitis, diverticulitis, appendicitis, ovarian cyst, PID, endometriosis   On arrival pt is nontoxic, vitals WNL. Exam significant for minimal lower abdominal tenderness, exam otherwise reassuring  Additional history obtained from signifi out cant other at bedside.  Side records obtained and reviewed including ED visit 2  days ago  I ordered IV fluids, Bentyl, Zofran and Pepcid for symptomatic management  Lab Tests:  I Ordered, reviewed, and interpreted labs, which included: No leukocytosis and normal hemoglobin, very mild low potassium, encourage increased dietary intake, no significant electrolyte derangements, normal renal and liver function normal lipase, negative pregnancy, urinalysis with squamous cells and bacteria present but no other signs of infection.    Imaging Studies ordered:  I reviewed CT completed on 2/7, no acute abnormalities noted, given that patient has not had a significant change in symptoms do not feel that repeat imaging is indicated at this time.  ED Course:   Patient's evaluation today has been reassuring, given chronic ongoing pain that has been occurring for months and reassuring blood work do not feel the patient warrants additional work-up at this time.  With Bentyl, fluids, Pepcid and Zofran patient's symptoms have resolved.  Question whether this may be IBS had a referral to GI placed and will follow-up.  Stressed the importance of taking Zofran as soon as you feel nauseated not waiting until vomiting which may be why this medication was not as helpful for her at home.  Discharged home in good condition.    Portions of this note were generated with [employer]. Dictation errors may occur despite best attempts at proofreading.         Final Clinical Impression(s) / ED Diagnoses Final diagnoses:  Lower abdominal pain  Nausea and vomiting, unspecified vomiting type    Rx / DC Orders ED Discharge Orders          Ordered    dicyclomine (BENTYL) 20 MG tablet  3 times daily PRN        06/06/21 1018              Jacqlyn Larsen, [place] 06/06/21 1304    Valarie Merino, MD 06/06/21 [phone]

## 2021-06-06 NOTE — ED Notes (Signed)
Pt provided beverage ?

## 2021-06-06 NOTE — Discharge Instructions (Addendum)
Your work-up has been reassuring.  You may continue to use Zofran as needed for nausea and vomiting, take this as soon as you feel nauseous, take Protonix once daily to help reduce acid production.  You can use Bentyl as needed for abdominal pain and cramping.  Follow-up with GI as planned.  Return for new or worsening symptoms.

## 2021-06-06 NOTE — ED Triage Notes (Signed)
Patient BIB EMS with report of lower abdominal pain ongoing for months. Pt endorses N/V, denies fever or chills.

## 2021-08-25 ENCOUNTER — Other Ambulatory Visit: Payer: Self-pay

## 2021-08-25 ENCOUNTER — Ambulatory Visit (HOSPITAL_BASED_OUTPATIENT_CLINIC_OR_DEPARTMENT_OTHER): Admit: 2021-08-25 | Payer: No Typology Code available for payment source

## 2021-08-25 ENCOUNTER — Emergency Department (HOSPITAL_BASED_OUTPATIENT_CLINIC_OR_DEPARTMENT_OTHER)
Admission: EM | Admit: 2021-08-25 | Discharge: 2021-08-25 | Disposition: A | Discharge status: Home / Self Care | Payer: No Typology Code available for payment source | Attending: Emergency Medicine | Admitting: Emergency Medicine

## 2021-08-25 ENCOUNTER — Encounter (HOSPITAL_BASED_OUTPATIENT_CLINIC_OR_DEPARTMENT_OTHER): Payer: Self-pay | Admitting: Emergency Medicine

## 2021-08-25 DIAGNOSIS — R110 Nausea: Secondary | ICD-10-CM | POA: Diagnosis not present

## 2021-08-25 DIAGNOSIS — R102 Pelvic and perineal pain: Secondary | ICD-10-CM | POA: Diagnosis present

## 2021-08-25 LAB — URINALYSIS, ROUTINE W REFLEX MICROSCOPIC
Bilirubin Urine: NEGATIVE
Glucose, UA: NEGATIVE mg/dL
Hgb urine dipstick: NEGATIVE
Ketones, ur: NEGATIVE mg/dL
Leukocytes,Ua: NEGATIVE
Nitrite: NEGATIVE
Protein, ur: NEGATIVE mg/dL
Specific Gravity, Urine: 1.01 (ref 1.005–1.030)
pH: 6.5 (ref 5.0–8.0)

## 2021-08-25 LAB — WET PREP, GENITAL
Sperm: NONE SEEN
Trich, Wet Prep: NONE SEEN
WBC, Wet Prep HPF POC: <10 (ref <10)
Yeast Wet Prep HPF POC: NONE SEEN

## 2021-08-25 LAB — PREGNANCY, URINE: Preg Test, Ur: NEGATIVE

## 2021-08-25 MED ORDER — HYDROCODONE-ACETAMINOPHEN 5-325 MG PO TABS
1.0000 | ORAL_TABLET | Freq: Once | ORAL | Status: AC
  Administered 2021-08-25: 1 via ORAL
  Filled 2021-08-25: qty 1

## 2021-08-25 MED ORDER — ONDANSETRON 4 MG PO TBDP
8.0000 mg | ORAL_TABLET | Freq: Once | ORAL | Status: AC
  Administered 2021-08-25: 8 mg via ORAL
  Filled 2021-08-25: qty 2

## 2021-08-25 MED ORDER — ONDANSETRON 8 MG PO TBDP: 8.0000 mg | ORAL_TABLET | Freq: Three times a day (TID) | ORAL | 1 refills | Status: DC | PRN

## 2021-08-25 NOTE — ED Triage Notes (Signed)
Reports lower abdominal pain that radiates into her lower back.  Reports hx of the same on and off for years.  Also reports some nausea.  ?

## 2021-08-25 NOTE — ED Provider Notes (Signed)
? ?[facility] ?Provider Note: [name], MD, FACEP ? ?CSN: [id number] ?MRN: [id number] ?ARRIVAL: 08/25/21 at 0346 ?ROOM: MH05/[facility]05 ? ? ?CHIEF COMPLAINT  ?Abdominal Pain ? ? ?HISTORY OF PRESENT ILLNESS  ?08/25/21 4:02 AM ?Samantha Buckley is a 21 y.o. female with a history of intermittent pelvic pain for which she has been seen numerous times.  She is here with lower abdominal pain that began about midnight.  Nothing precipitated this pain.  She rates it as an 8 out of 10 and it radiates into her lower back.  It is aching and sharp and somewhat worse with movement or palpation.  She is having nausea with it but no vomiting, diarrhea, vaginal bleeding, vaginal discharge, dysuria or hematuria.  Her last menstrual period in about 2 weeks ago. ? ? ?History reviewed. No pertinent past medical history. ? ?History reviewed. No pertinent surgical history. ? ?No family history on file. ? ?Social History  ? ?Tobacco Use  ? Smoking status: Never  ? Smokeless tobacco: Never  ?Vaping Use  ? Vaping Use: Every day  ?Substance Use Topics  ? Alcohol use: Yes  ? Drug use: Never  ? ? ?Prior to Admission medications   ?Medication Sig Start Date End Date Taking? Authorizing Provider  ?dicyclomine (BENTYL) 20 MG tablet Take 1 tablet (20 mg total) by mouth 3 (three) times daily as needed (Abdominal pain). 06/06/21   Dartha Lodge, PA-C  ?ondansetron (ZOFRAN ODT) 4 MG disintegrating tablet Take 1 tablet (4 mg total) by mouth every 8 (eight) hours as needed for nausea or vomiting. 06/04/21   Terrilee Files, MD  ?pantoprazole (PROTONIX) 20 MG tablet Take 1 tablet (20 mg total) by mouth daily. 06/04/21   Terrilee Files, MD  ? ? ?Allergies ?Penicillins ? ? ?REVIEW OF SYSTEMS  ?Negative except as noted here or in the History of Present Illness. ? ? ?PHYSICAL EXAMINATION  ?Initial Vital Signs ?Blood pressure 109/64, pulse 99, temperature 98.2 ?F (36.8 ?C), temperature source Oral, resp. rate 18, height 5\' 5"  (1.651 m), weight  102.5 kg, last menstrual period 07/29/2021, SpO2 100 %. ? ?Examination ?General: Well-developed, well-nourished female in no acute distress; appearance consistent with age of record ?HENT: normocephalic; atraumatic ?Eyes: Normal appearance ?Neck: supple ?Heart: regular rate and rhythm ?Lungs: clear to auscultation bilaterally ?Abdomen: soft; nondistended; mild suprapubic tenderness; bowel sounds present ?GU: Normal external genitalia; mucoid vaginal discharge; no vaginal bleeding; no cervical motion tenderness; bilateral adnexal tenderness ?Extremities: No deformity; full range of motion; pulses normal ?Neurologic: Awake, alert and oriented; motor function intact in all extremities and symmetric; no facial droop ?Skin: Warm and dry ?Psychiatric: Normal mood and affect ? ? ?RESULTS  ?Summary of this visit's results, reviewed and interpreted by myself: ? ? EKG Interpretation ? ?Date/Time:    ?Ventricular Rate:    ?PR Interval:    ?QRS Duration:   ?QT Interval:    ?QTC Calculation:   ?R Axis:     ?Text Interpretation:   ?  ? ?  ? ?Laboratory Studies: ?Results for orders placed or performed during the hospital encounter of 08/25/21 (from the past 24 hour(s))  ?Wet prep, genital     Status: Abnormal  ? Collection Time: 08/25/21  4:09 AM  ? Specimen: Cervix  ?Result Value Ref Range  ? Yeast Wet Prep HPF POC NONE SEEN NONE SEEN  ? Trich, Wet Prep NONE SEEN NONE SEEN  ? Clue Cells Wet Prep HPF POC PRESENT (A) NONE SEEN  ? WBC,  Wet Prep HPF POC <10 <10  ? Sperm NONE SEEN   ?Urinalysis, Routine w reflex microscopic Urine, Clean Catch     Status: None  ? Collection Time: 08/25/21  5:30 AM  ?Result Value Ref Range  ? Color, Urine YELLOW YELLOW  ? APPearance CLEAR CLEAR  ? Specific Gravity, Urine 1.010 1.005 - 1.030  ? pH 6.5 5.0 - 8.0  ? Glucose, UA NEGATIVE NEGATIVE mg/dL  ? Hgb urine dipstick NEGATIVE NEGATIVE  ? Bilirubin Urine NEGATIVE NEGATIVE  ? Ketones, ur NEGATIVE NEGATIVE mg/dL  ? Protein, ur NEGATIVE NEGATIVE mg/dL  ?  Nitrite NEGATIVE NEGATIVE  ? Leukocytes,Ua NEGATIVE NEGATIVE  ?Pregnancy, urine     Status: None  ? Collection Time: 08/25/21  5:30 AM  ?Result Value Ref Range  ? Preg Test, Ur NEGATIVE NEGATIVE  ? ?Imaging Studies: ?No results found. ? ?ED COURSE and MDM  ?Nursing notes, initial and subsequent vitals signs, including pulse oximetry, reviewed and interpreted by myself. ? ?Vitals:  ? 08/25/21 0347 08/25/21 0348  ?BP: 109/64   ?Pulse: 99   ?Resp: 18   ?Temp: 98.2 ?F (36.8 ?C)   ?TempSrc: Oral   ?SpO2: 100%   ?Weight:  102.5 kg  ?Height:  5\' 5"  (1.651 m)  ? ?Medications  ?HYDROcodone-acetaminophen (NORCO/VICODIN) 5-325 MG per tablet 1 tablet (has no administration in time range)  ?ondansetron (ZOFRAN-ODT) disintegrating tablet 8 mg (8 mg Oral Given 08/25/21 0434)  ? ?The cause of the patient's pelvic pain is unclear.  It may have a chronic etiology such as endometriosis.  Since it is somewhat midway between her cycles it could represent mittelschmerz.  We will have her return later this morning for a pelvic ultrasound. ? ? ?PROCEDURES  ?Procedures ? ? ?ED DIAGNOSES  ? ?  ICD-10-CM   ?1. Pelvic pain in female  R10.2   ?  ? ? ? ?  ?08/27/21, MD ?08/25/21 0542 ? ?

## 2021-08-27 LAB — GC/CHLAMYDIA PROBE AMP (~~LOC~~) NOT AT ARMC
Chlamydia: NEGATIVE
Comment: NEGATIVE
Comment: NORMAL
Neisseria Gonorrhea: NEGATIVE

## 2021-09-05 ENCOUNTER — Emergency Department (HOSPITAL_BASED_OUTPATIENT_CLINIC_OR_DEPARTMENT_OTHER): Payer: No Typology Code available for payment source | Admitting: Radiology

## 2021-09-05 ENCOUNTER — Encounter (HOSPITAL_BASED_OUTPATIENT_CLINIC_OR_DEPARTMENT_OTHER): Payer: Self-pay | Admitting: Obstetrics and Gynecology

## 2021-09-05 ENCOUNTER — Other Ambulatory Visit: Payer: Self-pay

## 2021-09-05 ENCOUNTER — Emergency Department (HOSPITAL_BASED_OUTPATIENT_CLINIC_OR_DEPARTMENT_OTHER)
Admission: EM | Admit: 2021-09-05 | Discharge: 2021-09-05 | Disposition: A | Discharge status: Home / Self Care | Payer: No Typology Code available for payment source | Attending: Emergency Medicine | Admitting: Emergency Medicine

## 2021-09-05 DIAGNOSIS — S92352A Displaced fracture of fifth metatarsal bone, left foot, initial encounter for closed fracture: Secondary | ICD-10-CM | POA: Diagnosis not present

## 2021-09-05 DIAGNOSIS — X501XXA Overexertion from prolonged static or awkward postures, initial encounter: Secondary | ICD-10-CM | POA: Diagnosis not present

## 2021-09-05 DIAGNOSIS — Y990 Civilian activity done for income or pay: Secondary | ICD-10-CM | POA: Insufficient documentation

## 2021-09-05 DIAGNOSIS — S99922A Unspecified injury of left foot, initial encounter: Secondary | ICD-10-CM | POA: Diagnosis present

## 2021-09-05 NOTE — ED Provider Notes (Signed)
?[facility] EM[facility]GENCY DEPT ?Provider Note ? ? ?CSN: [id number] ?Arrival date & time: 09/05/21  1545 ? ?  ? ?History ? ?Chief Complaint  ?Patient presents with  ? Fall  ? Ankle Pain  ? ? ?Samantha Buckley is a 21 y.o. female presents to the ED for evaluation of a left foot and ankle injury that occurred at work.  Patient was wearing high heels and fell, rolling her left ankle.  She heard a crack and has been unable to ambulate since then.  She denies numbness and tingling.  No treatment prior to arrival as she presented immediately to the emergency department.  She has no other systemic complaints. ? ? ?Fall ? ?Ankle Pain ? ?  ? ?Home Medications ?Prior to Admission medications   ?Medication Sig Start Date End Date Taking? Authorizing Provider  ?dicyclomine (BENTYL) 20 MG tablet Take 1 tablet (20 mg total) by mouth 3 (three) times daily as needed (Abdominal pain). 06/06/21   Dartha Lodge, PA-C  ?ondansetron (ZOFRAN-ODT) 8 MG disintegrating tablet Take 1 tablet (8 mg total) by mouth every 8 (eight) hours as needed. 08/25/21   Molpus, John, MD  ?pantoprazole (PROTONIX) 20 MG tablet Take 1 tablet (20 mg total) by mouth daily. 06/04/21   Terrilee Files, MD  ?   ? ?Allergies    ?Penicillins   ? ?Review of Systems   ?Review of Systems ? ?Physical Exam ?Updated Vital Signs ?BP (!) 141/88 (BP Location: Left Arm)   Pulse 79   Temp 99 ?F (37.2 ?C)   Resp 18   Ht 5\' 6"  (1.676 m)   Wt 108.9 kg   LMP 09/01/2021 (Approximate)   SpO2 100%   BMI 38.74 kg/m?  ?Physical Exam ?Vitals and nursing note reviewed.  ?Constitutional:   ?   General: She is not in acute distress. ?   Appearance: She is not ill-appearing.  ?HENT:  ?   Head: Atraumatic.  ?Eyes:  ?   Conjunctiva/sclera: Conjunctivae normal.  ?Cardiovascular:  ?   Rate and Rhythm: Normal rate and regular rhythm.  ?   Pulses: Normal pulses.  ?   Heart sounds: No murmur heard. ?Pulmonary:  ?   Effort: Pulmonary effort is normal. No respiratory distress.  ?    Breath sounds: Normal breath sounds.  ?Abdominal:  ?   General: Abdomen is flat. There is no distension.  ?   Palpations: Abdomen is soft.  ?   Tenderness: There is no abdominal tenderness.  ?Musculoskeletal:     ?   General: Normal range of motion.  ?   Cervical back: Normal range of motion.  ?     Feet: ? ?Feet:  ?   Comments: Moderate swelling and tenderness over the proximal fifth metatarsal.  2+ DP pulses.  Patient is able to wiggle her toes. ?Skin: ?   General: Skin is warm and dry.  ?   Capillary Refill: Capillary refill takes less than 2 seconds.  ?Neurological:  ?   General: No focal deficit present.  ?   Mental Status: She is alert.  ?Psychiatric:     ?   Mood and Affect: Mood normal.  ? ? ?ED Results / Procedures / Treatments   ?Labs ?(all labs ordered are listed, but only abnormal results are displayed) ?Labs Reviewed - No data to display ? ?EKG ?None ? ?Radiology ?DG Ankle Complete Left ? ?Result Date: 09/05/2021 ?CLINICAL DATA:  Fall.  Rolled left ankle. EXAM: LEFT ANKLE COMPLETE - 3+  VIEW; LEFT FOOT - COMPLETE 3+ VIEW COMPARISON:  None Available. FINDINGS: Left ankle: The ankle mortise is symmetric and intact. Joint spaces preserved. No acute fracture is seen. No dislocation. Left foot: There is a displaced fracture of the lateral base of fifth metatarsal with approximately 5 mm lateral and 5 mm proximal displacement of the smaller fracture component. No dislocation. IMPRESSION: Displaced, intra-articular oblique acute fracture of the base of the fifth metatarsal. Electronically Signed   By: Neita Garnet M.D.   On: 09/05/2021 16:17  ? ?DG Foot Complete Left ? ?Result Date: 09/05/2021 ?CLINICAL DATA:  Fall.  Rolled left ankle. EXAM: LEFT ANKLE COMPLETE - 3+ VIEW; LEFT FOOT - COMPLETE 3+ VIEW COMPARISON:  None Available. FINDINGS: Left ankle: The ankle mortise is symmetric and intact. Joint spaces preserved. No acute fracture is seen. No dislocation. Left foot: There is a displaced fracture of the  lateral base of fifth metatarsal with approximately 5 mm lateral and 5 mm proximal displacement of the smaller fracture component. No dislocation. IMPRESSION: Displaced, intra-articular oblique acute fracture of the base of the fifth metatarsal. Electronically Signed   By: Neita Garnet M.D.   On: 09/05/2021 16:17   ? ?Procedures ?Procedures  ? ? ?Medications Ordered in ED ?Medications - No data to display ? ?ED Course/ Medical Decision Making/ A&P ?  ?                        ?Medical Decision Making ? ?21 year old female in no acute distress, nontoxic-appearing presents to the ED for evaluation of a left foot injury.  Vitals without significant abnormality.  Patient has moderate swelling, tenderness to the proximal lateral fifth metatarsal.  Possible deformity.  I ordered and interpreted x-ray which shows displaced, intra-articular fracture of the base of the fifth metatarsal.  I agree with radiologist interpretation.  I consulted with Dr. Susa Simmonds with orthopedics to discuss whether patient should be weightbearing or nonweightbearing.  Given its location, he notes that patient can be weightbearing and should follow-up with him in his office either tomorrow or early next week.  Patient given boot and crutches here in the emergency department.  RICE protocol recommended.  Return precautions discussed.  Discharged home in good condition. ?Final Clinical Impression(s) / ED Diagnoses ?Final diagnoses:  ?Closed displaced fracture of fifth metatarsal bone of left foot, initial encounter  ? ? ?Rx / DC Orders ?ED Discharge Orders   ? ? None  ? ?  ? ? ?  ?Janell Quiet, [place] ?09/05/21 2231 ? ?  ?Virgina Norfolk, DO ?09/05/21 2315 ? ?

## 2021-09-05 NOTE — ED Triage Notes (Signed)
Pt BIB [facility] from work. Pt reports she fell and rolled her Left ankle. Pt unable to move her ankle d/t pain, pulses present, swelling noted, unable to apply any weight to her LLE. ? ?BP 124/84 ?HR 80 ?RR 18 ?99% RA  ?

## 2021-09-05 NOTE — Discharge Instructions (Addendum)
You fractured one of the bones in your left foot.  I given you a boot here and some crutches should you need it, however if you need to bear weight on it, that is fine.  I consulted with our orthopedic doctor who would like to have you follow-up in his office either tomorrow or early next week.  His office number is provided above, please schedule that appointment at your earliest convenience.  When you are at home, elevate the leg on some pillows when you are on the couch or in bed, ice on ice off 20 minutes at a time.  Take Tylenol or Motrin for pain. ?

## 2021-10-16 ENCOUNTER — Emergency Department (HOSPITAL_BASED_OUTPATIENT_CLINIC_OR_DEPARTMENT_OTHER)
Admission: EM | Admit: 2021-10-16 | Discharge: 2021-10-16 | Disposition: A | Discharge status: Home / Self Care | Payer: No Typology Code available for payment source | Attending: Emergency Medicine | Admitting: Emergency Medicine

## 2021-10-16 ENCOUNTER — Emergency Department (HOSPITAL_BASED_OUTPATIENT_CLINIC_OR_DEPARTMENT_OTHER): Payer: No Typology Code available for payment source

## 2021-10-16 ENCOUNTER — Encounter (HOSPITAL_BASED_OUTPATIENT_CLINIC_OR_DEPARTMENT_OTHER): Payer: Self-pay | Admitting: Emergency Medicine

## 2021-10-16 DIAGNOSIS — M79644 Pain in right finger(s): Secondary | ICD-10-CM | POA: Diagnosis not present

## 2021-10-16 DIAGNOSIS — L609 Nail disorder, unspecified: Secondary | ICD-10-CM | POA: Diagnosis not present

## 2021-10-16 NOTE — ED Provider Notes (Signed)
[facility] EMERGENCY DEPARTMENT Provider Note   CSN: [id number] Arrival date & time: 10/16/21  1240     History  Chief Complaint  Patient presents with   Nail Problem    Samantha Buckley is a 21 y.o. female.  HPI  Patient with no contributable medical history presents today due to right thumb pain.  This started acutely yesterday, patient states she was sitting on the porch when she pushed herself from sitting to standing and started having acute pain in the right thumb.  She states at the time her acrylic nail removed, did not fully detach and was able to come partially off and on.  Today the nail is intact, denies any fracture dislocation.  She is having pain to the thumb which is worse with flexion or extension or abduction of the thumb.  Denies any fevers or chills, no wrist pain.  Home Medications Prior to Admission medications   Medication Sig Start Date End Date Taking? Authorizing Provider  dicyclomine (BENTYL) 20 MG tablet Take 1 tablet (20 mg total) by mouth 3 (three) times daily as needed (Abdominal pain). 06/06/21   Dartha Lodge, PA-C  ondansetron (ZOFRAN-ODT) 8 MG disintegrating tablet Take 1 tablet (8 mg total) by mouth every 8 (eight) hours as needed. 08/25/21   Molpus, John, MD  pantoprazole (PROTONIX) 20 MG tablet Take 1 tablet (20 mg total) by mouth daily. 06/04/21   Terrilee Files, MD      Allergies    Penicillins    Review of Systems   Review of Systems  Physical Exam Updated Vital Signs BP 124/84   Pulse 91   Temp 98.6 F (37 C) (Oral)   Resp 20   Ht 5\' 5"  (1.651 m)   Wt 113.4 kg   LMP 09/26/2021   SpO2 100%   BMI 41.60 kg/m  Physical Exam Vitals and nursing note reviewed. Exam conducted with a chaperone present.  Constitutional:      General: She is not in acute distress.    Appearance: Normal appearance.  HENT:     Head: Normocephalic and atraumatic.  Eyes:     General: No scleral icterus.    Extraocular Movements: Extraocular  movements intact.     Pupils: Pupils are equal, round, and reactive to light.  Cardiovascular:     Pulses: Normal pulses.  Musculoskeletal:        General: Tenderness present.     Comments: ROM is intact to the right nail but does do subjective tenderness with flexion or extension.  The acrylic nail is not removable, there is some crusted blood underneath the nail but no obvious laceration.  No crepitus  Skin:    Capillary Refill: Capillary refill takes less than 2 seconds.     Coloration: Skin is not jaundiced.  Neurological:     Mental Status: She is alert. Mental status is at baseline.     Coordination: Coordination normal.     Comments: sensation to light touch grossly intact      ED Results / Procedures / Treatments   Labs (all labs ordered are listed, but only abnormal results are displayed) Labs Reviewed - No data to display  EKG None  Radiology DG Finger Thumb Right  Result Date: 10/16/2021 CLINICAL DATA:  Fall. EXAM: RIGHT THUMB 2+V COMPARISON:  None Available. FINDINGS: There is no evidence of fracture or dislocation. There is no evidence of arthropathy or other focal bone abnormality. Soft tissues are unremarkable. IMPRESSION: No acute osseous  abnormality. Electronically Signed   By: Maudry Mayhew M.D.   On: 10/16/2021 14:01    Procedures Procedures    Medications Ordered in ED Medications - No data to display  ED Course/ Medical Decision Making/ A&P                           Medical Decision Making Amount and/or Complexity of Data Reviewed Radiology: ordered.   Patient presents with nail pain.  On exam she has brisk cap refill, radial pulse 2+ neurovascular intact with good ROM although the degree of subjective pain.  X-ray obtained due to pain, no obvious fractures.  The nail is not detached, no nailbed avulsion as it is attached.  I do not feel she needs any additional work-up at this time, encouraged her to follow-up with her nail salon to remove the  acrylic nail if indicated.  No tufts given negative x-ray, additionally I do not think she has a nailbed laceration and even if she did has been greater than 48 hours so would not be indicated to close.  At this time patient is appropriate for outpatient follow-up, she does not currently by PCP so referral was provided.  Patient was discharged in the stable condition.        Final Clinical Impression(s) / ED Diagnoses Final diagnoses:  Nail abnormality    Rx / DC Orders ED Discharge Orders     None         Theron Arista, PA-C 10/16/21 1416    Virgina Norfolk, DO 10/17/21 1601

## 2021-10-16 NOTE — Discharge Instructions (Signed)
Take Tylenol and Motrin for pain.  Follow-up with your nail salon to remove the acrylic nail.  The nail may come off, it will grow back on its own.  Do not pull the nail off as that hurt and is unnecessary.  If it is fully attached you may follow-up with good blood supply.

## 2021-10-16 NOTE — ED Triage Notes (Addendum)
Pt reports acrylic nail on RT thumb was cracked 2 days ago; nail is still attached, but natural nail lifts completely off nail bed

## 2021-10-29 ENCOUNTER — Emergency Department (HOSPITAL_BASED_OUTPATIENT_CLINIC_OR_DEPARTMENT_OTHER)
Admission: EM | Admit: 2021-10-29 | Discharge: 2021-10-29 | Disposition: A | Discharge status: Home / Self Care | Payer: No Typology Code available for payment source | Attending: Emergency Medicine | Admitting: Emergency Medicine

## 2021-10-29 ENCOUNTER — Encounter (HOSPITAL_BASED_OUTPATIENT_CLINIC_OR_DEPARTMENT_OTHER): Payer: Self-pay | Admitting: Emergency Medicine

## 2021-10-29 ENCOUNTER — Other Ambulatory Visit: Payer: Self-pay

## 2021-10-29 DIAGNOSIS — R509 Fever, unspecified: Secondary | ICD-10-CM | POA: Diagnosis not present

## 2021-10-29 DIAGNOSIS — R000 Tachycardia, unspecified: Secondary | ICD-10-CM | POA: Insufficient documentation

## 2021-10-29 DIAGNOSIS — F17210 Nicotine dependence, cigarettes, uncomplicated: Secondary | ICD-10-CM | POA: Diagnosis not present

## 2021-10-29 DIAGNOSIS — R112 Nausea with vomiting, unspecified: Secondary | ICD-10-CM | POA: Insufficient documentation

## 2021-10-29 DIAGNOSIS — R21 Rash and other nonspecific skin eruption: Secondary | ICD-10-CM | POA: Insufficient documentation

## 2021-10-29 LAB — BASIC METABOLIC PANEL
Anion gap: 8 (ref 5–15)
BUN: 14 mg/dL (ref 6–20)
CO2: 26 mmol/L (ref 22–32)
Calcium: 9.1 mg/dL (ref 8.9–10.3)
Chloride: 104 mmol/L (ref 98–111)
Creatinine, Ser: 0.78 mg/dL (ref 0.44–1.00)
GFR, Estimated: >60 mL/min (ref >60)
Glucose, Bld: 117 mg/dL — ABNORMAL HIGH (ref 70–99)
Potassium: 3.6 mmol/L (ref 3.5–5.1)
Sodium: 138 mmol/L (ref 135–145)

## 2021-10-29 LAB — CBC WITH DIFFERENTIAL/PLATELET
Abs Immature Granulocytes: 0.14 10*3/uL — ABNORMAL HIGH (ref 0.00–0.07)
Basophils Absolute: 0 10*3/uL (ref 0.0–0.1)
Basophils Relative: 0 %
Eosinophils Absolute: 0.3 10*3/uL (ref 0.0–0.5)
Eosinophils Relative: 3 %
HCT: 40.3 % (ref 36.0–46.0)
Hemoglobin: 12.9 g/dL (ref 12.0–15.0)
Immature Granulocytes: 1 %
Lymphocytes Relative: 28 %
Lymphs Abs: 3 10*3/uL (ref 0.7–4.0)
MCH: 26.9 pg (ref 26.0–34.0)
MCHC: 32 g/dL (ref 30.0–36.0)
MCV: 84 fL (ref 80.0–100.0)
Monocytes Absolute: 1 10*3/uL (ref 0.1–1.0)
Monocytes Relative: 9 %
Neutro Abs: 6.5 10*3/uL (ref 1.7–7.7)
Neutrophils Relative %: 59 %
Platelets: 319 10*3/uL (ref 150–400)
RBC: 4.8 MIL/uL (ref 3.87–5.11)
RDW: 13.2 % (ref 11.5–15.5)
WBC: 11 10*3/uL — ABNORMAL HIGH (ref 4.0–10.5)
nRBC: 0 % (ref 0.0–0.2)

## 2021-10-29 LAB — HCG, SERUM, QUALITATIVE: Preg, Serum: NEGATIVE

## 2021-10-29 MED ORDER — FAMOTIDINE IN NACL 20-0.9 MG/50ML-% IV SOLN
20.0000 mg | Freq: Once | INTRAVENOUS | Status: AC
  Administered 2021-10-29: 20 mg via INTRAVENOUS
  Filled 2021-10-29: qty 50

## 2021-10-29 MED ORDER — ONDANSETRON 8 MG PO TBDP: 8.0000 mg | ORAL_TABLET | Freq: Three times a day (TID) | ORAL | 1 refills | Status: DC | PRN

## 2021-10-29 MED ORDER — ONDANSETRON HCL 4 MG/2ML IJ SOLN
4.0000 mg | Freq: Once | INTRAMUSCULAR | Status: AC
  Administered 2021-10-29: 4 mg via INTRAVENOUS
  Filled 2021-10-29: qty 2

## 2021-10-29 MED ORDER — DIPHENHYDRAMINE HCL 50 MG/ML IJ SOLN
25.0000 mg | Freq: Once | INTRAMUSCULAR | Status: AC
  Administered 2021-10-29: 25 mg via INTRAVENOUS
  Filled 2021-10-29: qty 1

## 2021-10-29 MED ORDER — HYDROCORTISONE 1 % EX CREA
TOPICAL_CREAM | Freq: Two times a day (BID) | CUTANEOUS | Status: DC
  Filled 2021-10-29: qty 28

## 2021-10-29 MED ORDER — SODIUM CHLORIDE 0.9 % IV BOLUS
1000.0000 mL | Freq: Once | INTRAVENOUS | Status: AC
Start: 2021-10-29 — End: 2021-10-29
  Administered 2021-10-29: 1000 mL via INTRAVENOUS

## 2021-10-29 MED ORDER — METOCLOPRAMIDE HCL 5 MG/ML IJ SOLN
10.0000 mg | Freq: Once | INTRAMUSCULAR | Status: DC | PRN
  Filled 2021-10-29: qty 2

## 2021-10-29 NOTE — ED Notes (Signed)
Patient verbalizes understanding of discharge instructions. Opportunity for questioning and answers were provided. Armband removed by staff, pt discharged from ED. Ambulated out to lobby  

## 2021-10-29 NOTE — ED Notes (Signed)
Pt had large episode of emesis

## 2021-10-29 NOTE — ED Triage Notes (Signed)
Pt in with constant nausea, onset 8pm last night. Denies any abdominal pain or actual episodes of emesis

## 2021-10-29 NOTE — ED Provider Notes (Signed)
[facility] Provider Note: [name], MD, FACEP  CSN: [id number] MRN: [id number] ARRIVAL: 10/29/21 at 0416 ROOM: MH08/MH08   CHIEF COMPLAINT  Nausea   HISTORY OF PRESENT ILLNESS  10/29/21 4:31 AM Samantha Buckley is a 21 y.o. female with nausea off-and-on for several days.  It acutely worsened about 8 PM yesterday evening.  She began vomiting on arrival in the emergency department.  She is having no associated diarrhea or abdominal pain.  She is having a fever with this.  She was noted to be tachycardic on arrival.  She has also been having a pruritic rash for several days which she thinks is hives.  She has no known food allergies.   History reviewed. No pertinent past medical history.  Past Surgical History:  Procedure Laterality Date   FOOT SURGERY Left     No family history on file.  Social History   Tobacco Use   Smoking status: Every Day    Types: Cigarettes   Smokeless tobacco: Never  Vaping Use   Vaping Use: Every day  Substance Use Topics   Alcohol use: Yes   Drug use: Yes    Types: Marijuana    Prior to Admission medications   Medication Sig Start Date End Date Taking? Authorizing Provider  dicyclomine (BENTYL) 20 MG tablet Take 1 tablet (20 mg total) by mouth 3 (three) times daily as needed (Abdominal pain). 06/06/21   Dartha Lodge, PA-C  ondansetron (ZOFRAN-ODT) 8 MG disintegrating tablet Take 1 tablet (8 mg total) by mouth every 8 (eight) hours as needed for nausea or vomiting. 10/29/21   Selby Foisy, MD  pantoprazole (PROTONIX) 20 MG tablet Take 1 tablet (20 mg total) by mouth daily. 06/04/21   Terrilee Files, MD    Allergies Penicillins   REVIEW OF SYSTEMS  Negative except as noted here or in the History of Present Illness.   PHYSICAL EXAMINATION  Initial Vital Signs Blood pressure (!) 149/91, pulse (!) 124, temperature 98.9 F (37.2 C), temperature source Oral, resp. rate 20, weight 115.7 kg, last menstrual period 10/24/2021,  SpO2 100 %.  Examination General: Well-developed, well-nourished female in no acute distress; appearance consistent with age of record HENT: normocephalic; atraumatic Eyes: Normal appearance Neck: supple Heart: regular rate and rhythm; tachycardia Lungs: clear to auscultation bilaterally Abdomen: soft; nondistended; nontender; bowel sounds present Extremities: No deformity; full range of motion Neurologic: Awake, alert and oriented; motor function intact in all extremities and symmetric; no facial droop Skin: Warm and dry; scattered hyperpigmented, mildly hyperkeratotic plaques of the trunk Psychiatric: Normal mood and affect   RESULTS  Summary of this visit's results, reviewed and interpreted by myself:   EKG Interpretation  Date/Time:    Ventricular Rate:    PR Interval:    QRS Duration:   QT Interval:    QTC Calculation:   R Axis:     Text Interpretation:         Laboratory Studies: Results for orders placed or performed during the hospital encounter of 10/29/21 (from the past 24 hour(s))  hCG, serum, qualitative     Status: None   Collection Time: 10/29/21  4:41 AM  Result Value Ref Range   Preg, Serum NEGATIVE NEGATIVE  Basic metabolic panel     Status: Abnormal   Collection Time: 10/29/21  4:41 AM  Result Value Ref Range   Sodium 138 135 - 145 mmol/L   Potassium 3.6 3.5 - 5.1 mmol/L   Chloride 104 98 -  111 mmol/L   CO2 26 22 - 32 mmol/L   Glucose, Bld 117 (H) 70 - 99 mg/dL   BUN 14 6 - 20 mg/dL   Creatinine, Ser 5.80 0.44 - 1.00 mg/dL   Calcium 9.1 8.9 - 99.8 mg/dL   GFR, Estimated >33 >82 mL/min   Anion gap 8 5 - 15  CBC with Differential     Status: Abnormal   Collection Time: 10/29/21  4:41 AM  Result Value Ref Range   WBC 11.0 (H) 4.0 - 10.5 K/uL   RBC 4.80 3.87 - 5.11 MIL/uL   Hemoglobin 12.9 12.0 - 15.0 g/dL   HCT 50.5 39.7 - 67.3 %   MCV 84.0 80.0 - 100.0 fL   MCH 26.9 26.0 - 34.0 pg   MCHC 32.0 30.0 - 36.0 g/dL   RDW 41.9 37.9 - 02.4 %    Platelets 319 150 - 400 K/uL   nRBC 0.0 0.0 - 0.2 %   Neutrophils Relative % 59 %   Neutro Abs 6.5 1.7 - 7.7 K/uL   Lymphocytes Relative 28 %   Lymphs Abs 3.0 0.7 - 4.0 K/uL   Monocytes Relative 9 %   Monocytes Absolute 1.0 0.1 - 1.0 K/uL   Eosinophils Relative 3 %   Eosinophils Absolute 0.3 0.0 - 0.5 K/uL   Basophils Relative 0 %   Basophils Absolute 0.0 0.0 - 0.1 K/uL   Immature Granulocytes 1 %   Abs Immature Granulocytes 0.14 (H) 0.00 - 0.07 K/uL   Imaging Studies: No results found.  ED COURSE and MDM  Nursing notes, initial and subsequent vitals signs, including pulse oximetry, reviewed and interpreted by myself.  Vitals:   10/29/21 0430 10/29/21 0500 10/29/21 0530 10/29/21 0600  BP: (!) 145/100 97/63 114/75 113/72  Pulse: (!) 111 92 71 79  Resp:  18  18  Temp:      TempSrc:      SpO2: 100% 100% 99% 100%  Weight:       Medications  metoCLOPramide (REGLAN) injection 10 mg (has no administration in time range)  hydrocortisone cream 1 % (has no administration in time range)  ondansetron (ZOFRAN) injection 4 mg (4 mg Intravenous Given 10/29/21 0446)  sodium chloride 0.9 % bolus 1,000 mL (1,000 mLs Intravenous New Bag/Given 10/29/21 0442)  famotidine (PEPCID) IVPB 20 mg premix (20 mg Intravenous New Bag/Given 10/29/21 0453)  diphenhydrAMINE (BENADRYL) injection 25 mg (25 mg Intravenous Given 10/29/21 0446)   6:09 AM Patient feeling significantly better after IV fluids and medications.  She is able to drink fluids without vomiting.  The cause of the rash is unclear.  It does not appear urticarial.  It could represent a mild psoriasis or eczema.  It is unclear if this is related to her nausea and vomiting.   PROCEDURES  Procedures   ED DIAGNOSES     ICD-10-CM   1. Nausea and vomiting in adult  R11.2     2. Rash  R21          Kenedi Cilia, Jonny Ruiz, MD 10/29/21 [phone]

## 2021-11-17 ENCOUNTER — Encounter (HOSPITAL_COMMUNITY): Payer: Self-pay | Admitting: Emergency Medicine

## 2021-11-17 ENCOUNTER — Emergency Department (HOSPITAL_COMMUNITY)
Admission: EM | Admit: 2021-11-17 | Discharge: 2021-11-17 | Disposition: A | Discharge status: Home / Self Care | Payer: No Typology Code available for payment source | Attending: Emergency Medicine | Admitting: Emergency Medicine

## 2021-11-17 DIAGNOSIS — H1132 Conjunctival hemorrhage, left eye: Secondary | ICD-10-CM | POA: Diagnosis not present

## 2021-11-17 DIAGNOSIS — H538 Other visual disturbances: Secondary | ICD-10-CM | POA: Diagnosis present

## 2021-11-17 DIAGNOSIS — H40052 Ocular hypertension, left eye: Secondary | ICD-10-CM | POA: Diagnosis not present

## 2021-11-17 MED ORDER — TETRACAINE HCL 0.5 % OP SOLN
1.0000 [drp] | Freq: Once | OPHTHALMIC | Status: AC
Start: 2021-11-17 — End: 2021-11-17
  Administered 2021-11-17: 1 [drp] via OPHTHALMIC
  Filled 2021-11-17: qty 4

## 2021-11-17 MED ORDER — ONDANSETRON 4 MG PO TBDP
4.0000 mg | ORAL_TABLET | Freq: Once | ORAL | Status: AC
  Administered 2021-11-17: 4 mg via ORAL
  Filled 2021-11-17: qty 1

## 2021-11-17 MED ORDER — FLUORESCEIN SODIUM 1 MG OP STRP
1.0000 | ORAL_STRIP | Freq: Once | OPHTHALMIC | Status: AC
Start: 2021-11-17 — End: 2021-11-17
  Administered 2021-11-17: 1 via OPHTHALMIC
  Filled 2021-11-17: qty 1
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
## 2021-11-17 NOTE — Discharge Instructions (Signed)
You MUST be seen by the ophthalmologist on Monday morning.  Please call him.  The number is listed on this document.  You should not return to work until you are cleared by ophthalmology.  If your symptoms change or worsen return to the emergency department.

## 2022-02-18 ENCOUNTER — Other Ambulatory Visit: Payer: Self-pay

## 2022-02-18 ENCOUNTER — Emergency Department (HOSPITAL_BASED_OUTPATIENT_CLINIC_OR_DEPARTMENT_OTHER)
Admission: EM | Admit: 2022-02-18 | Discharge: 2022-02-18 | Disposition: A | Discharge status: Home / Self Care | Payer: No Typology Code available for payment source | Attending: Emergency Medicine | Admitting: Emergency Medicine

## 2022-02-18 ENCOUNTER — Encounter (HOSPITAL_BASED_OUTPATIENT_CLINIC_OR_DEPARTMENT_OTHER): Payer: Self-pay | Admitting: Pediatrics

## 2022-02-18 ENCOUNTER — Ambulatory Visit: Payer: Self-pay

## 2022-02-18 DIAGNOSIS — J069 Acute upper respiratory infection, unspecified: Secondary | ICD-10-CM | POA: Diagnosis not present

## 2022-02-18 DIAGNOSIS — R059 Cough, unspecified: Secondary | ICD-10-CM | POA: Diagnosis present

## 2022-02-18 DIAGNOSIS — Z1152 Encounter for screening for COVID-19: Secondary | ICD-10-CM | POA: Insufficient documentation

## 2022-02-18 LAB — RESP PANEL BY RT-PCR (FLU A&B, COVID) ARPGX2
Influenza A by PCR: NEGATIVE
Influenza B by PCR: NEGATIVE
SARS Coronavirus 2 by RT PCR: NEGATIVE

## 2022-02-18 MED ORDER — PROMETHAZINE-DM 6.25-15 MG/5ML PO SYRP: 2.5000 mL | ORAL_SOLUTION | Freq: Four times a day (QID) | ORAL | 0 refills | Status: DC | PRN

## 2022-02-18 NOTE — ED Triage Notes (Signed)
C/O flu like symptoms since the 20th, unable to get relief from OTC meds; reports recent UC visit with negative covid test.

## 2022-02-18 NOTE — Discharge Instructions (Addendum)
You were seen in the emergency department today for a viral upper respiratory infection.  I have prescribed you promethazine syrup.  Take.  Please continue take Tylenol and Motrin as well as decongestants for your symptoms.  Please drink plenty of fluids and rest.  Please return for shortness of breath and fever concerning for pneumonia.

## 2022-02-18 NOTE — ED Provider Notes (Signed)
[place] EMERGENCY DEPARTMENT Provider Note   CSN: [id number] Arrival date & time: 02/18/22  1044     History  Chief Complaint  Patient presents with   URI    Samantha Buckley is a 21 y.o. female. With no significant past medical history who presents to ED with cough.  Patient states that she has had symptoms for about 1 week.  She describes them as flulike symptoms.  She states she has had nonproductive cough, rhinorrhea, congestion and headache.  States that she has been using Tylenol and over-the-counter cough and cold medication without relief of symptoms.  She denies fevers, nausea, vomiting, diarrhea, shortness of breath or chest pain.  Denies any recent travel or sick contacts.   URI Presenting symptoms: congestion, cough, rhinorrhea and sore throat   Presenting symptoms: no fever   Associated symptoms: headaches        Home Medications Prior to Admission medications   Medication Sig Start Date End Date Taking? Authorizing Provider  promethazine-dextromethorphan (PROMETHAZINE-DM) 6.25-15 MG/5ML syrup Take 2.5 mLs by mouth 4 (four) times daily as needed for cough. 02/18/22  Yes Mickie Hillier, PA-C  dicyclomine (BENTYL) 20 MG tablet Take 1 tablet (20 mg total) by mouth 3 (three) times daily as needed (Abdominal pain). 06/06/21   Jacqlyn Larsen, PA-C  ondansetron (ZOFRAN-ODT) 8 MG disintegrating tablet Take 1 tablet (8 mg total) by mouth every 8 (eight) hours as needed for nausea or vomiting. 10/29/21   Molpus, John, MD  pantoprazole (PROTONIX) 20 MG tablet Take 1 tablet (20 mg total) by mouth daily. 06/04/21   Hayden Rasmussen, MD      Allergies    Penicillins    Review of Systems   Review of Systems  Constitutional:  Negative for fever.  HENT:  Positive for congestion, rhinorrhea and sore throat.   Respiratory:  Positive for cough. Negative for shortness of breath.   Cardiovascular:  Negative for chest pain.  Gastrointestinal:  Negative for nausea and  vomiting.  Neurological:  Positive for headaches.  All other systems reviewed and are negative.   Physical Exam Updated Vital Signs BP 136/88 (BP Location: Left Arm)   Pulse 100   Temp 98.7 F (37.1 C) (Oral)   Resp 18   Ht 5\' 7"  (1.702 m)   Wt 124.7 kg   SpO2 99%   BMI 43.06 kg/m  Physical Exam Vitals and nursing note reviewed.  Constitutional:      General: She is not in acute distress.    Appearance: Normal appearance. She is obese. She is ill-appearing. She is not toxic-appearing.  HENT:     Head: Normocephalic and atraumatic.     Nose: Congestion present.     Mouth/Throat:     Mouth: Mucous membranes are moist.     Pharynx: Oropharynx is clear. Posterior oropharyngeal erythema present.  Eyes:     General: No scleral icterus.    Extraocular Movements: Extraocular movements intact.     Pupils: Pupils are equal, round, and reactive to light.  Cardiovascular:     Pulses: Normal pulses.     Heart sounds: No murmur heard. Pulmonary:     Effort: Pulmonary effort is normal. No respiratory distress.     Breath sounds: Normal breath sounds.  Musculoskeletal:     Cervical back: Neck supple.  Lymphadenopathy:     Cervical: Cervical adenopathy present.  Skin:    General: Skin is warm and dry.     Capillary Refill: Capillary refill  takes less than 2 seconds.     Findings: No rash.  Neurological:     General: No focal deficit present.     Mental Status: She is alert and oriented to person, place, and time. Mental status is at baseline.  Psychiatric:        Mood and Affect: Mood normal.        Behavior: Behavior normal.        Thought Content: Thought content normal.        Judgment: Judgment normal.     ED Results / Procedures / Treatments   Labs (all labs ordered are listed, but only abnormal results are displayed) Labs Reviewed  RESP PANEL BY RT-PCR (FLU A&B, COVID) [id number]    EKG None  Radiology No results found.  Procedures Procedures   Medications  Ordered in ED Medications - No data to display  ED Course/ Medical Decision Making/ A&P                           Medical Decision Making Risk Prescription drug management.  This patient presents to the ED with chief complaint(s) of cough with pertinent past medical history of none which further complicates the presenting complaint. The complaint involves an extensive differential diagnosis and also carries with it a high risk of complications and morbidity.    The differential diagnosis includes viral upper respiratory infection including COVID or flu, pneumonia medication side effect.,  CHF, asthma or COPD, etc.  Additional history obtained: Additional history obtained from  none Records reviewed Care Everywhere/External Records and Primary Care Documents  ED Course and Reassessment: 21 year old female who presents to the emergency department with cough, congestion, rhinorrhea, headache.  Physical exam is notable for some oropharyngeal erythema without tonsillar exudates.  No evidence of PTA or RPA.  Her lungs are clear without wheezing, rhonchi, rales. Sinuses are nontender to palpation. COVID and flu is negative Given clear lung sounds without fever or hypoxia will not pursue chest x-ray at this time.  Patient is requesting cough medication.  I prescribed her Promethazine DM which is what she prefers over [place].  We will have her follow-up at community clinic if needed.  Given return precautions for worsening shortness of breath with fever.  She has no history of asthma or chronic lung disease concerning for other etiology.  No medication that could be side effect.  Given age doubt CHF as cause of her symptoms.  Given return precautions.  Otherwise, she is safe for discharge  Independent labs interpretation:  The following labs were independently interpreted: COVID and flu negative  Independent visualization of imaging: Not indicated  Consultation: - Consulted or discussed  management/test interpretation w/ external professional: Not indicated  Consideration for admission or further workup: Not indicated Social Determinants of health: No PCP, community clinic information given Final Clinical Impression(s) / ED Diagnoses Final diagnoses:  Viral URI with cough    Rx / DC Orders ED Discharge Orders          Ordered    promethazine-dextromethorphan (PROMETHAZINE-DM) 6.25-15 MG/5ML syrup  4 times daily PRN        02/18/22 1343              Cristopher Peru, PA-C 02/18/22 1354    Jacalyn Lefevre, MD 02/18/22 1435

## 2022-07-30 ENCOUNTER — Ambulatory Visit: Payer: No Typology Code available for payment source | Admitting: Family Medicine

## 2022-12-02 IMAGING — DX DG FINGER THUMB 2+V*R*
4 series · 4 of 4 positions shown · non-contrast
Comparison: None Available.

CLINICAL DATA: Fall.

EXAM:
RIGHT THUMB 2+V

[finger ap (1 of 2)]
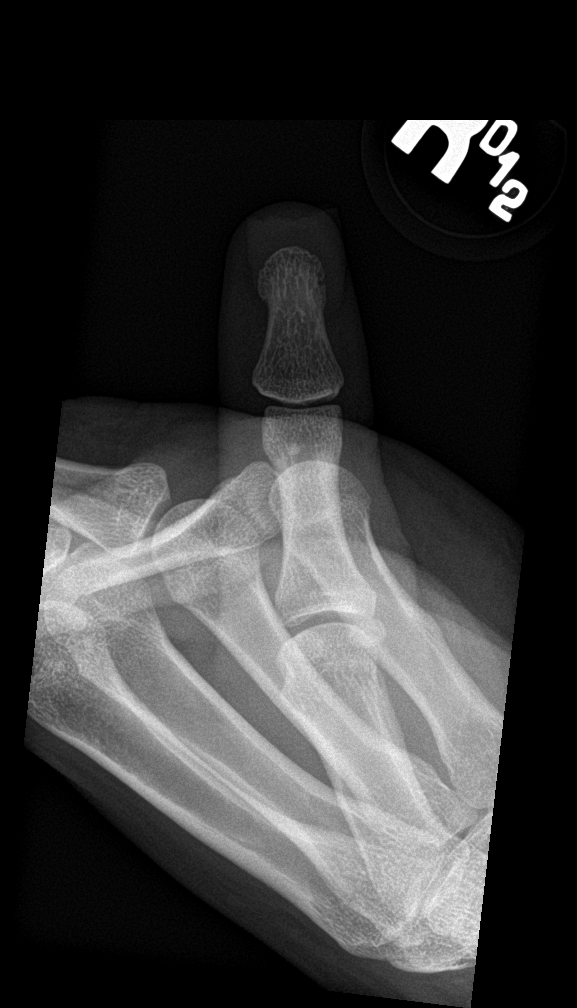

[finger obl]
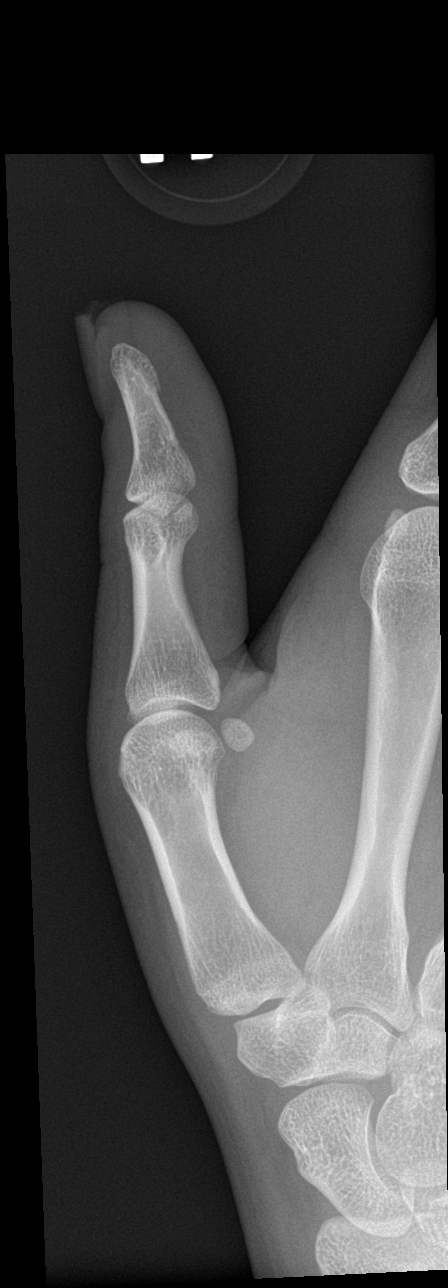

[finger lat]
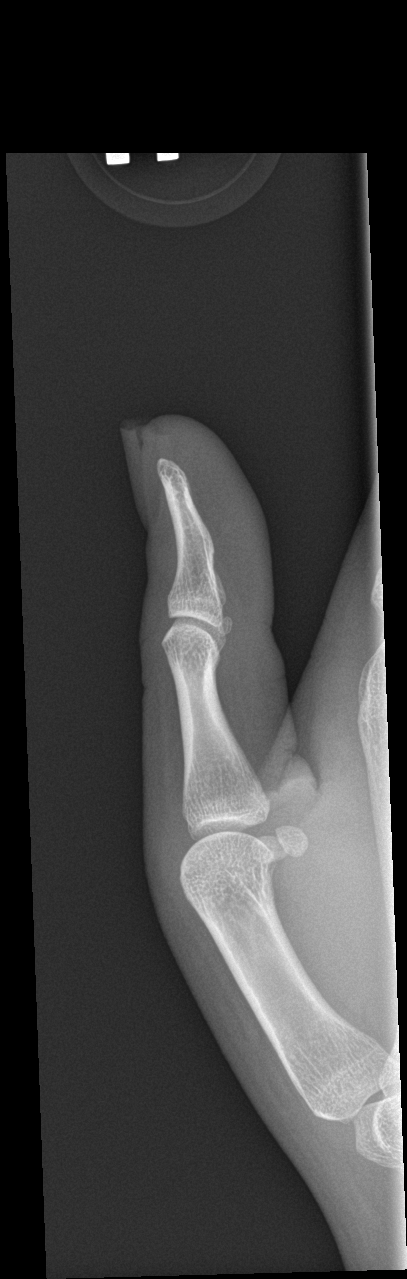

[finger ap (2 of 2)]
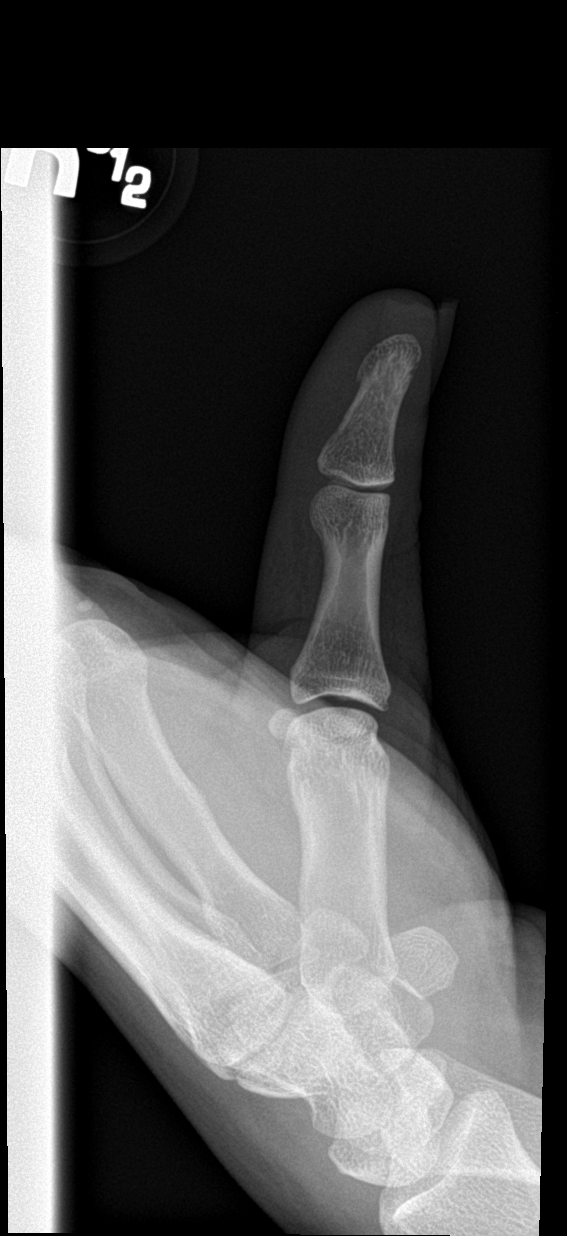

[4 of 4 positions shown; findings below may reference images not displayed]

FINDINGS: There is no evidence of fracture or dislocation. There is no
evidence of arthropathy or other focal bone abnormality. Soft
tissues are unremarkable.
IMPRESSION: No acute osseous abnormality.

## 2023-03-01 ENCOUNTER — Emergency Department (HOSPITAL_BASED_OUTPATIENT_CLINIC_OR_DEPARTMENT_OTHER)
Admission: EM | Admit: 2023-03-01 | Discharge: 2023-03-01 | Discharge status: Against Medical Advice | Payer: No Typology Code available for payment source | Source: Home / Self Care

## 2023-03-01 ENCOUNTER — Other Ambulatory Visit: Payer: Self-pay

## 2023-03-04 ENCOUNTER — Encounter (HOSPITAL_BASED_OUTPATIENT_CLINIC_OR_DEPARTMENT_OTHER): Payer: Self-pay | Admitting: Emergency Medicine

## 2023-03-04 ENCOUNTER — Other Ambulatory Visit: Payer: Self-pay

## 2023-03-04 ENCOUNTER — Emergency Department (HOSPITAL_BASED_OUTPATIENT_CLINIC_OR_DEPARTMENT_OTHER)
Admission: EM | Admit: 2023-03-04 | Discharge: 2023-03-04 | Disposition: A | Discharge status: Home / Self Care | Payer: No Typology Code available for payment source | Attending: Emergency Medicine | Admitting: Emergency Medicine

## 2023-03-04 DIAGNOSIS — J069 Acute upper respiratory infection, unspecified: Secondary | ICD-10-CM | POA: Diagnosis not present

## 2023-03-04 DIAGNOSIS — Z20822 Contact with and (suspected) exposure to covid-19: Secondary | ICD-10-CM | POA: Insufficient documentation

## 2023-03-04 DIAGNOSIS — R0981 Nasal congestion: Secondary | ICD-10-CM | POA: Diagnosis present

## 2023-03-04 LAB — RESP PANEL BY RT-PCR (RSV, FLU A&B, COVID)  RVPGX2
Influenza A by PCR: NEGATIVE
Influenza B by PCR: NEGATIVE
Resp Syncytial Virus by PCR: NEGATIVE
SARS Coronavirus 2 by RT PCR: NEGATIVE

## 2023-03-04 MED ORDER — ALBUTEROL SULFATE HFA 108 (90 BASE) MCG/ACT IN AERS
1.0000 | INHALATION_SPRAY | RESPIRATORY_TRACT | Status: DC | PRN
  Administered 2023-03-04: 2 via RESPIRATORY_TRACT
  Filled 2023-03-04: qty 6.7

## 2023-03-04 NOTE — ED Notes (Signed)

## 2023-03-04 NOTE — ED Provider Notes (Signed)
[place] [facility] Provider Note   CSN: [id number] Arrival date & time: 03/04/23  0857     History  Chief Complaint  Patient presents with   Nasal Congestion    Ameerah Huffstetler is a 22 y.o. female.  Patient is a 22 year old female who presents with URI symptoms.  She reports a 3-day history of runny nose congestion and coughing.  Her cough is sometimes productive but mostly nonproductive.  She denies any known fevers.  No vomiting although she has had some nausea with coughing spells.  She does report some ongoing shortness of breath.  No underlying history of lung disease.  No leg pain or swelling.       Home Medications Prior to Admission medications   Medication Sig Start Date End Date Taking? Authorizing Provider  dicyclomine (BENTYL) 20 MG tablet Take 1 tablet (20 mg total) by mouth 3 (three) times daily as needed (Abdominal pain). 06/06/21   Dartha Lodge, PA-C  ondansetron (ZOFRAN-ODT) 8 MG disintegrating tablet Take 1 tablet (8 mg total) by mouth every 8 (eight) hours as needed for nausea or vomiting. 10/29/21   Molpus, John, MD  pantoprazole (PROTONIX) 20 MG tablet Take 1 tablet (20 mg total) by mouth daily. 06/04/21   Terrilee Files, MD  promethazine-dextromethorphan (PROMETHAZINE-DM) 6.25-15 MG/5ML syrup Take 2.5 mLs by mouth 4 (four) times daily as needed for cough. 02/18/22   Cristopher Peru, PA-C      Allergies    Penicillins    Review of Systems   Review of Systems  Constitutional:  Positive for fatigue. Negative for chills, diaphoresis and fever.  HENT:  Positive for congestion and rhinorrhea. Negative for sneezing.   Eyes: Negative.   Respiratory:  Positive for cough, chest tightness and shortness of breath.   Cardiovascular:  Negative for chest pain and leg swelling.  Gastrointestinal:  Negative for abdominal pain, blood in stool, diarrhea, nausea and vomiting.  Genitourinary:  Negative for difficulty urinating, flank  pain, frequency and hematuria.  Musculoskeletal:  Negative for arthralgias and back pain.  Skin:  Negative for rash.  Neurological:  Negative for dizziness, speech difficulty, weakness, numbness and headaches.    Physical Exam Updated Vital Signs BP 122/72   Pulse 100   Temp 98 F (36.7 C)   Resp 14   Wt 108.9 kg   LMP 02/11/2023 (Approximate)   SpO2 98%   BMI 37.59 kg/m  Physical Exam Constitutional:      Appearance: She is well-developed.  HENT:     Head: Normocephalic and atraumatic.  Eyes:     Pupils: Pupils are equal, round, and reactive to light.  Cardiovascular:     Rate and Rhythm: Normal rate and regular rhythm.     Heart sounds: Normal heart sounds.  Pulmonary:     Effort: Pulmonary effort is normal. No respiratory distress.     Breath sounds: Normal breath sounds. No wheezing or rales.  Chest:     Chest wall: No tenderness.  Abdominal:     General: Bowel sounds are normal.     Palpations: Abdomen is soft.     Tenderness: There is no abdominal tenderness. There is no guarding or rebound.  Musculoskeletal:        General: Normal range of motion.     Cervical back: Normal range of motion and neck supple.     Comments: No edema or calf tenderness  Lymphadenopathy:     Cervical: No cervical adenopathy.  Skin:    General: Skin is warm and dry.     Findings: No rash.  Neurological:     Mental Status: She is alert and oriented to person, place, and time.     ED Results / Procedures / Treatments   Labs (all labs ordered are listed, but only abnormal results are displayed) Labs Reviewed  RESP PANEL BY RT-PCR (RSV, FLU A&B, COVID)  [id number]    EKG None  Radiology No results found.  Procedures Procedures    Medications Ordered in ED Medications  albuterol (VENTOLIN HFA) 108 (90 Base) MCG/ACT inhaler 1-2 puff (2 puffs Inhalation Given 03/04/23 4098)    ED Course/ Medical Decision Making/ A&P                                 Medical Decision  Making Risk Prescription drug management.   Patient is a 22 year old who presents with URI symptoms.  No fevers.  She reports some shortness of breath but her lungs are clear on exam.  She has no hypoxia.  No increased work of breathing.  No unexplained tachycardia.  No other symptoms that be more concerning for PE.  Clinically I do not hear any suggestions of pneumonia.  I discussed doing a chest x-ray but patient declines at this point.  Her viral panel was negative for COVID and influenza.  She was discharged home in good condition.  Symptomatic care instructions were given.  I offered her prescription for cough medicine but she said she already had some at home.  Return precautions were given.  Final Clinical Impression(s) / ED Diagnoses Final diagnoses:  Viral upper respiratory tract infection    Rx / DC Orders ED Discharge Orders     None         Rolan Bucco, MD 03/04/23 1029

## 2023-03-04 NOTE — ED Triage Notes (Signed)
Flu like symptoms x 3 days , she said ,. nasal congestion , cough , sore throat , chest pain x 1 day .

## 2023-03-09 ENCOUNTER — Encounter (HOSPITAL_COMMUNITY): Payer: Self-pay

## 2023-03-09 ENCOUNTER — Other Ambulatory Visit: Payer: Self-pay

## 2023-03-09 ENCOUNTER — Emergency Department (HOSPITAL_COMMUNITY)
Admission: EM | Admit: 2023-03-09 | Discharge: 2023-03-09 | Disposition: A | Discharge status: Home / Self Care | Payer: No Typology Code available for payment source | Attending: Emergency Medicine | Admitting: Emergency Medicine

## 2023-03-09 ENCOUNTER — Emergency Department (HOSPITAL_COMMUNITY): Payer: No Typology Code available for payment source

## 2023-03-09 DIAGNOSIS — R0981 Nasal congestion: Secondary | ICD-10-CM | POA: Insufficient documentation

## 2023-03-09 DIAGNOSIS — R059 Cough, unspecified: Secondary | ICD-10-CM | POA: Diagnosis present

## 2023-03-09 MED ORDER — PROMETHAZINE-DM 6.25-15 MG/5ML PO SYRP: 5.0000 mL | ORAL_SOLUTION | Freq: Four times a day (QID) | ORAL | 0 refills | Status: AC | PRN

## 2023-03-09 NOTE — ED Triage Notes (Signed)
Patient is here for evaluation of flu like symptoms X 3 weeks. Reports seeing numerous doctors for the same, but is not getting any better. Reports that she has only been prescribed albuterol inhaler.

## 2023-03-09 NOTE — Discharge Instructions (Addendum)
Thank you for letting us evaluate you today.  Your x-ray was negative for pneumonia or fluid in the lungs.  I do not feel that we need to retest you for COVID, RSV, flu as this would not change management.  Your symptoms are likely still due to a virus and no antibiotics are warranted at this time.  You may use Tylenol, ibuprofen, cough drops, over-the-counter cough and flu medicine for symptoms.  You may follow-up with your PCP once you establish care regarding routine medical maintenance and assessment of improvement of symptoms.  I also included [facility] as a resource if you need it  I sent Promethazine DM to your [facility] pharmacy in [place] for cough and symptoms.  Please return to emergency department if you experience shortness of breath to include unable to speak in full complete sentences or wheezing, chest pain

## 2023-03-09 NOTE — ED Provider Notes (Signed)
[place] EMERGENCY DEPARTMENT AT [facility] Provider Note   CSN: [id number] Arrival date & time: 03/09/23  1040     History  Chief Complaint  Patient presents with   Cough    Samantha Buckley is a 22 y.o. female with PNA ("at 12-14yo"), depression, HLD presents emergency department for evaluation of productive "dark green" cough in the morning, mild shortness of breath with exertion, nasal congestion, sore throat for the past week. She reports that she has had a cough for about 2 weeks. She reports that she gets fevers at night with Tmax 100.73F which are resolved with tylenol. Last evening, she had two episodes of vomiting from repeated forceful coughing. No known exposure or sick contacts.   She was seen 03/04/2023 with similar symptoms.  Her respiratory panel was negative. She was provided nebulizer treatment with no improvement ro symptoms.  She denies chest pain, shortness of breath at rest, pedal edema, leg pain, hemoptysis, and history of clot.   Cough Associated symptoms: shortness of breath   Associated symptoms: no chest pain, no chills, no fever, no headaches and no wheezing        Home Medications Prior to Admission medications   Medication Sig Start Date End Date Taking? Authorizing Provider  dicyclomine (BENTYL) 20 MG tablet Take 1 tablet (20 mg total) by mouth 3 (three) times daily as needed (Abdominal pain). 06/06/21   Dartha Lodge, PA-C  ondansetron (ZOFRAN-ODT) 8 MG disintegrating tablet Take 1 tablet (8 mg total) by mouth every 8 (eight) hours as needed for nausea or vomiting. 10/29/21   Molpus, John, MD  pantoprazole (PROTONIX) 20 MG tablet Take 1 tablet (20 mg total) by mouth daily. 06/04/21   Terrilee Files, MD  promethazine-dextromethorphan (PROMETHAZINE-DM) 6.25-15 MG/5ML syrup Take 2.5 mLs by mouth 4 (four) times daily as needed for cough. 02/18/22   Cristopher Peru, PA-C      Allergies    Penicillins    Review of Systems   Review of  Systems  Constitutional:  Negative for chills, fatigue and fever.  HENT:  Positive for congestion. Negative for trouble swallowing and voice change.   Respiratory:  Positive for cough and shortness of breath. Negative for chest tightness and wheezing.   Cardiovascular:  Negative for chest pain, palpitations and leg swelling.  Gastrointestinal:  Positive for vomiting. Negative for abdominal pain, constipation, diarrhea and nausea.  Neurological:  Negative for dizziness, seizures, weakness, light-headedness, numbness and headaches.    Physical Exam Updated Vital Signs BP (!) 153/111 (BP Location: Left Arm)   Pulse 73   Temp 97.9 F (36.6 C) (Oral)   Resp 16   Ht 5\' 7"  (1.702 m)   Wt 108.9 kg   LMP 02/11/2023 (Approximate)   SpO2 100%   BMI 37.60 kg/m  Physical Exam Vitals and nursing note reviewed.  Constitutional:      General: She is not in acute distress.    Appearance: Normal appearance. She is not ill-appearing or toxic-appearing.  HENT:     Head: Normocephalic and atraumatic.     Right Ear: Tympanic membrane, ear canal and external ear normal.     Left Ear: Tympanic membrane, ear canal and external ear normal.     Nose: Congestion present.     Mouth/Throat:     Mouth: Mucous membranes are moist.     Pharynx: No oropharyngeal exudate, posterior oropharyngeal erythema or uvula swelling.     Tonsils: No tonsillar exudate or tonsillar abscesses. 1+  on the right. 1+ on the left.     Comments: Uvula midline Eyes:     General: No scleral icterus.       Right eye: No discharge.        Left eye: No discharge.     Conjunctiva/sclera: Conjunctivae normal.  Cardiovascular:     Rate and Rhythm: Normal rate.     Pulses: Normal pulses.     Comments: Dorsalis pedis 2+ equal bilaterally Pulmonary:     Effort: Pulmonary effort is normal. No tachypnea, accessory muscle usage, respiratory distress or retractions.     Breath sounds: Normal breath sounds. No stridor. No wheezing or  rhonchi.     Comments: Speaks in full and complete sentences without difficulty Chest:     Chest wall: No tenderness.  Abdominal:     General: There is no distension.     Palpations: Abdomen is soft. There is no mass.     Tenderness: There is no abdominal tenderness. There is no guarding.  Musculoskeletal:     Cervical back: Normal range of motion and neck supple. No rigidity or tenderness.     Right lower leg: No edema.     Left lower leg: No edema.     Comments: No edema or TTP to LEs bilaterally  Lymphadenopathy:     Cervical: No cervical adenopathy.  Skin:    General: Skin is warm.     Capillary Refill: Capillary refill takes less than 2 seconds.     Coloration: Skin is not jaundiced or pale.     Comments: No abnormal skin changes to LEs bilaterally  Neurological:     Mental Status: She is alert and oriented to person, place, and time. Mental status is at baseline.     ED Results / Procedures / Treatments   Labs (all labs ordered are listed, but only abnormal results are displayed) Labs Reviewed - No data to display  EKG None  Radiology No results found.  Procedures Procedures    Medications Ordered in ED Medications - No data to display  ED Course/ Medical Decision Making/ A&P                                 Medical Decision Making Amount and/or Complexity of Data Reviewed Radiology: ordered.  Risk Prescription drug management.   Patient presents to the ED for concern of cough, dyspnea with exertion, nasal congestion, sore throat for the past week, this involves an extensive number of treatment options, and is a complaint that carries with it a high risk of complications and morbidity.  The differential diagnosis includes viral URI, flu, rhinitis, PNA, asthma   Co morbidities that complicate the patient evaluation  None   Additional history obtained:  Additional history obtained from  Nursing and Outside Medical Records   External records from  outside source obtained and reviewed including ED visit from 03/04/2023 Lab work significant for negative respiratory panel Reassuring PE    Imaging Studies ordered:  I ordered imaging studies including CXR  I independently visualized and interpreted imaging which showed no acute cardiopulmonary disease, PNA, effusion I agree with the radiologist interpretation    Medicines ordered and prescription drug management:  I ordered Promethazine DM prescription for cough, congestion  I have reviewed the patients home medicines and have made adjustments as needed    Problem List / ED Course:  Viral URI   Reevaluation:  After  the interventions noted above, I reevaluated the patient and found that they have :stayed the same   Dispostion:  Patient is resting comfortably in chair.  She is not ill-appearing.  Vital signs within normal limits.  No fever, tachycardia noted.  See HPI.  Physical exam is reassuring.  Lung sounds CTAB.  There is some nasal congestion.  Posterior oropharynx is without exudates with midline uvula (see PE).  Patient is able to speak in full and complete sentences during exam with no hypoxia.  She does not appear in any signs of physical distress.  Patient reports that she has had pneumonia as a kid in the past and was worried as her symptoms are persisting since 03/01/2023, will obtain x-ray to rule out pneumonia and alleviate concerns.  I do not feel that lab work is required as patient is well-appearing and I do not have concern for sepsis. Shared decision making with patient regarding another resp panel swab but we decided it was not necessary as it was neg on 11/6 while experiencing similar symptoms and it will not change management.  She does not have significant coughing episodes, vomiting in ED. XR negative for PNA or fluid  After consideration of the diagnostic results and the patients response to treatment, I feel that the patent would benefit from outpatient  management and follow-up with PCP. Patient recently received recommendation for PCP from her mother and will call for an appointment today to establish as a new patient and follow-up if symptoms do not improve by then.  Explained findings, disposition, return to emerged department precautions with patient expresses understanding agrees with plan.  Return to emerged department precautions include but not limited to intractable vomiting, significant worsening of symptoms, lethargy, altered mental status.  All questions answered to patient satisfaction.         Final Clinical Impression(s) / ED Diagnoses Final diagnoses:  None    Rx / DC Orders ED Discharge Orders     None         Judithann Sheen, PA 03/09/23 1437    Loetta Rough, MD 03/09/23 1546

## 2023-03-29 ENCOUNTER — Encounter (HOSPITAL_BASED_OUTPATIENT_CLINIC_OR_DEPARTMENT_OTHER): Payer: Self-pay

## 2023-03-29 ENCOUNTER — Emergency Department (HOSPITAL_BASED_OUTPATIENT_CLINIC_OR_DEPARTMENT_OTHER)
Admission: EM | Admit: 2023-03-29 | Discharge: 2023-03-29 | Disposition: A | Discharge status: Home / Self Care | Payer: No Typology Code available for payment source | Attending: Emergency Medicine | Admitting: Emergency Medicine

## 2023-03-29 ENCOUNTER — Other Ambulatory Visit: Payer: Self-pay

## 2023-03-29 ENCOUNTER — Emergency Department (HOSPITAL_BASED_OUTPATIENT_CLINIC_OR_DEPARTMENT_OTHER): Payer: No Typology Code available for payment source

## 2023-03-29 DIAGNOSIS — R1030 Lower abdominal pain, unspecified: Secondary | ICD-10-CM | POA: Diagnosis not present

## 2023-03-29 DIAGNOSIS — R112 Nausea with vomiting, unspecified: Secondary | ICD-10-CM | POA: Insufficient documentation

## 2023-03-29 DIAGNOSIS — R051 Acute cough: Secondary | ICD-10-CM | POA: Diagnosis not present

## 2023-03-29 DIAGNOSIS — Z20822 Contact with and (suspected) exposure to covid-19: Secondary | ICD-10-CM | POA: Diagnosis not present

## 2023-03-29 LAB — URINALYSIS, ROUTINE W REFLEX MICROSCOPIC
Bilirubin Urine: NEGATIVE
Glucose, UA: NEGATIVE mg/dL
Hgb urine dipstick: NEGATIVE
Ketones, ur: NEGATIVE mg/dL
Leukocytes,Ua: NEGATIVE
Nitrite: NEGATIVE
Protein, ur: NEGATIVE mg/dL
Specific Gravity, Urine: 1.025 (ref 1.005–1.030)
pH: 6.5 (ref 5.0–8.0)

## 2023-03-29 LAB — HEPATIC FUNCTION PANEL
ALT: 14 U/L (ref 0–44)
AST: 16 U/L (ref 15–41)
Albumin: 4 g/dL (ref 3.5–5.0)
Alkaline Phosphatase: 70 U/L (ref 38–126)
Bilirubin, Direct: 0.1 mg/dL (ref 0.0–0.2)
Indirect Bilirubin: 0.6 mg/dL (ref 0.3–0.9)
Total Bilirubin: 0.7 mg/dL (ref <1.2)
Total Protein: 7.9 g/dL (ref 6.5–8.1)

## 2023-03-29 LAB — RESP PANEL BY RT-PCR (RSV, FLU A&B, COVID)  RVPGX2
Influenza A by PCR: NEGATIVE
Influenza B by PCR: NEGATIVE
Resp Syncytial Virus by PCR: NEGATIVE
SARS Coronavirus 2 by RT PCR: NEGATIVE

## 2023-03-29 LAB — CBC WITH DIFFERENTIAL/PLATELET
Abs Immature Granulocytes: 0.06 10*3/uL (ref 0.00–0.07)
Basophils Absolute: 0.1 10*3/uL (ref 0.0–0.1)
Basophils Relative: 0 %
Eosinophils Absolute: 0.1 10*3/uL (ref 0.0–0.5)
Eosinophils Relative: 1 %
HCT: 41.4 % (ref 36.0–46.0)
Hemoglobin: 13.2 g/dL (ref 12.0–15.0)
Immature Granulocytes: 1 %
Lymphocytes Relative: 17 %
Lymphs Abs: 1.9 10*3/uL (ref 0.7–4.0)
MCH: 26.5 pg (ref 26.0–34.0)
MCHC: 31.9 g/dL (ref 30.0–36.0)
MCV: 83.1 fL (ref 80.0–100.0)
Monocytes Absolute: 1 10*3/uL (ref 0.1–1.0)
Monocytes Relative: 9 %
Neutro Abs: 8 10*3/uL — ABNORMAL HIGH (ref 1.7–7.7)
Neutrophils Relative %: 72 %
Platelets: 362 10*3/uL (ref 150–400)
RBC: 4.98 MIL/uL (ref 3.87–5.11)
RDW: 14.3 % (ref 11.5–15.5)
WBC: 11.1 10*3/uL — ABNORMAL HIGH (ref 4.0–10.5)
nRBC: 0 % (ref 0.0–0.2)

## 2023-03-29 LAB — I-STAT CHEM 8, ED
BUN: 13 mg/dL (ref 6–20)
Calcium, Ion: 1.21 mmol/L (ref 1.15–1.40)
Chloride: 104 mmol/L (ref 98–111)
Creatinine, Ser: 0.7 mg/dL (ref 0.44–1.00)
Glucose, Bld: 100 mg/dL — ABNORMAL HIGH (ref 70–99)
HCT: 44 % (ref 36.0–46.0)
Hemoglobin: 15 g/dL (ref 12.0–15.0)
Potassium: 3.8 mmol/L (ref 3.5–5.1)
Sodium: 139 mmol/L (ref 135–145)
TCO2: 22 mmol/L (ref 22–32)

## 2023-03-29 LAB — LIPASE, BLOOD: Lipase: 31 U/L (ref 11–51)

## 2023-03-29 LAB — HCG, QUANTITATIVE, PREGNANCY: hCG, Beta Chain, Quant, S: <1 m[IU]/mL (ref <5)

## 2023-03-29 MED ORDER — ONDANSETRON 4 MG PO TBDP
4.0000 mg | ORAL_TABLET | Freq: Once | ORAL | Status: AC
  Administered 2023-03-29: 4 mg via ORAL
  Filled 2023-03-29: qty 1

## 2023-03-29 MED ORDER — BENZONATATE 100 MG PO CAPS: 100.0000 mg | ORAL_CAPSULE | Freq: Three times a day (TID) | ORAL | 0 refills | Status: DC

## 2023-03-29 MED ORDER — ONDANSETRON 4 MG PO TBDP: 4.0000 mg | ORAL_TABLET | Freq: Three times a day (TID) | ORAL | 0 refills | Status: DC | PRN

## 2023-03-29 MED ORDER — BENZONATATE 100 MG PO CAPS
100.0000 mg | ORAL_CAPSULE | Freq: Once | ORAL | Status: AC
  Administered 2023-03-29: 100 mg via ORAL
  Filled 2023-03-29: qty 1

## 2023-03-29 NOTE — ED Triage Notes (Signed)
The patient woke up with cough and vomiting and abd pain.

## 2023-03-29 NOTE — ED Provider Notes (Signed)
EMERGENCY DEPARTMENT AT [facility] Provider Note   CSN: [id number] Arrival date & time: 03/29/23  0846     History  Chief Complaint  Patient presents with   Cough   Emesis   Abdominal Pain    Samantha Buckley is a 22 y.o. female with no significant past medical history who presents to the ED due to cough, nausea, vomiting, and abdominal pain that started this morning around 2 AM.  Patient admits to 3-4 episodes of nonbloody, nonbilious emesis.  No diarrhea.  Admits to lower abdominal pain.  No vaginal discharge.  Denies urinary symptoms.  No concern for STIs.  Patient states she recently got over RSV that later turned into pneumonia.  No fever or chills.  Denies chest pain and shortness of breath.  No history of asthma.  History obtained from patient and past medical records. No interpreter used during encounter.       Home Medications Prior to Admission medications   Medication Sig Start Date End Date Taking? Authorizing Provider  benzonatate (TESSALON) 100 MG capsule Take 1 capsule (100 mg total) by mouth every 8 (eight) hours. 03/29/23  Yes Kole Hilyard C, PA-C  ondansetron (ZOFRAN-ODT) 4 MG disintegrating tablet Take 1 tablet (4 mg total) by mouth every 8 (eight) hours as needed for nausea or vomiting. 03/29/23  Yes Willett Lefeber, Merla Riches, PA-C  dicyclomine (BENTYL) 20 MG tablet Take 1 tablet (20 mg total) by mouth 3 (three) times daily as needed (Abdominal pain). 06/06/21   Rosezella Rumpf, PA-C  ondansetron (ZOFRAN-ODT) 8 MG disintegrating tablet Take 1 tablet (8 mg total) by mouth every 8 (eight) hours as needed for nausea or vomiting. 10/29/21   Molpus, John, MD  pantoprazole (PROTONIX) 20 MG tablet Take 1 tablet (20 mg total) by mouth daily. 06/04/21   Terrilee Files, MD  promethazine-dextromethorphan (PROMETHAZINE-DM) 6.25-15 MG/5ML syrup Take 5 mLs by mouth 4 (four) times daily as needed for cough. 03/09/23   Judithann Sheen, PA      Allergies     Penicillins    Review of Systems   Review of Systems  Constitutional:  Negative for fever.  Respiratory:  Positive for cough. Negative for shortness of breath.   Cardiovascular:  Negative for chest pain.  Gastrointestinal:  Positive for abdominal pain, nausea and vomiting. Negative for diarrhea.    Physical Exam Updated Vital Signs BP (!) 141/91   Pulse 96   Temp 97.6 F (36.4 C) (Oral)   Resp 18   Ht 5\' 7"  (1.702 m)   Wt 108 kg   LMP 02/11/2023 (Approximate)   SpO2 100%   BMI 37.29 kg/m  Physical Exam Vitals and nursing note reviewed.  Constitutional:      General: She is not in acute distress.    Appearance: She is not ill-appearing.  HENT:     Head: Normocephalic.  Eyes:     Pupils: Pupils are equal, round, and reactive to light.  Cardiovascular:     Rate and Rhythm: Normal rate and regular rhythm.     Pulses: Normal pulses.     Heart sounds: Normal heart sounds. No murmur heard.    No friction rub. No gallop.  Pulmonary:     Effort: Pulmonary effort is normal.     Breath sounds: Normal breath sounds.     Comments: Respirations equal and unlabored, patient able to speak in full sentences, lungs clear to auscultation bilaterally Abdominal:     General: Abdomen is  flat. There is no distension.     Palpations: Abdomen is soft.     Tenderness: There is abdominal tenderness. There is no guarding or rebound.     Comments: Mild lower quadrant tenderness without rebound or guarding  Musculoskeletal:        General: Normal range of motion.     Cervical back: Neck supple.  Skin:    General: Skin is warm and dry.  Neurological:     General: No focal deficit present.     Mental Status: She is alert.  Psychiatric:        Mood and Affect: Mood normal.        Behavior: Behavior normal.     ED Results / Procedures / Treatments   Labs (all labs ordered are listed, but only abnormal results are displayed) Labs Reviewed  CBC WITH DIFFERENTIAL/PLATELET - Abnormal;  Notable for the following components:      Result Value   WBC 11.1 (*)    Neutro Abs 8.0 (*)    All other components within normal limits  I-STAT CHEM 8, ED - Abnormal; Notable for the following components:   Glucose, Bld 100 (*)    All other components within normal limits  RESP PANEL BY RT-PCR (RSV, FLU A&B, COVID)  RVPGX2  LIPASE, BLOOD  HEPATIC FUNCTION PANEL  URINALYSIS, ROUTINE W REFLEX MICROSCOPIC  HCG, QUANTITATIVE, PREGNANCY    EKG None  Radiology DG Chest Portable 1 View  Result Date: 03/29/2023 CLINICAL DATA:  Cough and vomiting beginning this morning. EXAM: PORTABLE CHEST 1 VIEW COMPARISON:  03/09/2023 FINDINGS: The heart size and mediastinal contours are within normal limits. Both lungs are clear. The visualized skeletal structures are unremarkable. IMPRESSION: No active disease. Electronically Signed   By: Danae Orleans M.D.   On: 03/29/2023 09:59    Procedures Procedures    Medications Ordered in ED Medications  ondansetron (ZOFRAN-ODT) disintegrating tablet 4 mg (4 mg Oral Given 03/29/23 0941)  benzonatate (TESSALON) capsule 100 mg (100 mg Oral Given 03/29/23 0941)    ED Course/ Medical Decision Making/ A&P Clinical Course as of 03/29/23 1130  Sun Mar 29, 2023  0951 Creatinine: 0.70 [CA]  0951 BUN: 13 [CA]  0959 WBC(!): 11.1 [CA]    Clinical Course User Index [CA] Mannie Stabile, PA-C                                 Medical Decision Making Amount and/or Complexity of Data Reviewed Independent Historian: friend Labs: ordered. Decision-making details documented in ED Course. Radiology: ordered and independent interpretation performed. Decision-making details documented in ED Course.  Risk Prescription drug management.   This patient presents to the ED for concern of cough, N/V/D, this involves an extensive number of treatment options, and is a complaint that carries with it a high risk of complications and morbidity.  The differential diagnosis  includes gastroenteritis, pancreatitis, UTI, PNA, etc  22 year old female presents to the ED due to nausea, vomiting, and abdominal pain that started this morning.  Also admits to a cough.  No fever or chills.  Denies chest pain and shortness of breath.  No diarrhea.  No sick contacts.  Denies urinary and vaginal symptoms.  Upon arrival patient afebrile, not tachycardic or hypoxic.  Patient in no acute distress.  Abdomen soft, nondistended with very mild lower quadrant tenderness without rebound or guarding.  Low suspicion for acute abdomen, so will hold  off on CT abdomen.  Patient denies any urinary or vaginal symptoms.  Denies any concerns for STIs.  Low suspicion for PID.  Pregnancy test ordered to rule out ectopic pregnancy.  Routine labs ordered.  Lungs clear to auscultation bilaterally.  Per patient she was recently diagnosed with pneumonia so will obtain chest x-ray to rule out evidence of worsening pneumonia.  RVP ordered.  Tessalon Perles and Zofran given.  CBC significant for slight leukocytosis at 11.1.  Normal hemoglobin.  RVP negative.  Lipase normal.  Low suspicion for pancreatitis.  Hepatic function panel normal.  Chem-8 with normal renal function.  Pregnancy test negative.  Low suspicion for ectopic pregnancy.  Chest x-ray personally reviewed and interpreted which is negative for signs of pneumonia.  Agree with radiology interpretation. UA negative for signs of infection. Suspect viral etiology.  Patient discharged with symptomatic treatment.  Patient able to tolerate p.o. here in the ED.  Patient stable for discharge.Strict ED precautions discussed with patient. Patient states understanding and agrees to plan. Patient discharged home in no acute distress and stable vitals  No PCP on file Lives at home       Final Clinical Impression(s) / ED Diagnoses Final diagnoses:  Acute cough  Nausea and vomiting, unspecified vomiting type    Rx / DC Orders ED Discharge Orders           Ordered    ondansetron (ZOFRAN-ODT) 4 MG disintegrating tablet  Every 8 hours PRN        03/29/23 1000    benzonatate (TESSALON) 100 MG capsule  Every 8 hours        03/29/23 1000              Jesusita Oka 03/29/23 1130    Pricilla Loveless, MD 03/29/23 1440

## 2023-03-29 NOTE — Discharge Instructions (Signed)
It was a pleasure taking care of you today.  As discussed, all of your labs are reassuring.  Chest x-ray did not show evidence of pneumonia.  Your COVID and flu test were negative.  I suspect you have a viral infection causing your symptoms.  I am sending you home with nausea medication and cough medication.  Take as needed.  Please follow-up with PCP if symptoms do not improve over the next few days.  Return to the ER for any worsening symptoms.

## 2023-05-18 ENCOUNTER — Emergency Department (HOSPITAL_BASED_OUTPATIENT_CLINIC_OR_DEPARTMENT_OTHER)
Admission: EM | Admit: 2023-05-18 | Discharge: 2023-05-19 | Disposition: A | Discharge status: Home / Self Care | Payer: No Typology Code available for payment source

## 2023-05-18 ENCOUNTER — Encounter (HOSPITAL_BASED_OUTPATIENT_CLINIC_OR_DEPARTMENT_OTHER): Payer: Self-pay | Admitting: Radiology

## 2023-05-18 ENCOUNTER — Emergency Department (HOSPITAL_BASED_OUTPATIENT_CLINIC_OR_DEPARTMENT_OTHER): Payer: No Typology Code available for payment source

## 2023-05-18 ENCOUNTER — Other Ambulatory Visit: Payer: Self-pay

## 2023-05-18 DIAGNOSIS — R110 Nausea: Secondary | ICD-10-CM | POA: Insufficient documentation

## 2023-05-18 DIAGNOSIS — R1030 Lower abdominal pain, unspecified: Secondary | ICD-10-CM | POA: Insufficient documentation

## 2023-05-18 DIAGNOSIS — R109 Unspecified abdominal pain: Secondary | ICD-10-CM | POA: Diagnosis present

## 2023-05-18 LAB — COMPREHENSIVE METABOLIC PANEL
ALT: 14 U/L (ref 0–44)
AST: 18 U/L (ref 15–41)
Albumin: 3.5 g/dL (ref 3.5–5.0)
Alkaline Phosphatase: 65 U/L (ref 38–126)
Anion gap: 9 (ref 5–15)
BUN: 9 mg/dL (ref 6–20)
CO2: 21 mmol/L — ABNORMAL LOW (ref 22–32)
Calcium: 8.9 mg/dL (ref 8.9–10.3)
Chloride: 107 mmol/L (ref 98–111)
Creatinine, Ser: 0.6 mg/dL (ref 0.44–1.00)
GFR, Estimated: >60 mL/min (ref >60)
Glucose, Bld: 95 mg/dL (ref 70–99)
Potassium: 3.5 mmol/L (ref 3.5–5.1)
Sodium: 137 mmol/L (ref 135–145)
Total Bilirubin: 0.3 mg/dL (ref 0.0–1.2)
Total Protein: 7 g/dL (ref 6.5–8.1)

## 2023-05-18 LAB — LIPASE, BLOOD: Lipase: 41 U/L (ref 11–51)

## 2023-05-18 LAB — CBC
HCT: 37.7 % (ref 36.0–46.0)
Hemoglobin: 12.1 g/dL (ref 12.0–15.0)
MCH: 26.6 pg (ref 26.0–34.0)
MCHC: 32.1 g/dL (ref 30.0–36.0)
MCV: 82.9 fL (ref 80.0–100.0)
Platelets: 251 10*3/uL (ref 150–400)
RBC: 4.55 MIL/uL (ref 3.87–5.11)
RDW: 13.3 % (ref 11.5–15.5)
WBC: 10.3 10*3/uL (ref 4.0–10.5)
nRBC: 0 % (ref 0.0–0.2)

## 2023-05-18 MED ORDER — IOHEXOL 300 MG/ML  SOLN
125.0000 mL | Freq: Once | INTRAMUSCULAR | Status: AC | PRN
  Administered 2023-05-19: 125 mL via INTRAVENOUS

## 2023-05-18 MED ORDER — ONDANSETRON HCL 4 MG/2ML IJ SOLN
4.0000 mg | Freq: Once | INTRAMUSCULAR | Status: AC
Start: 2023-05-18 — End: 2023-05-18
  Administered 2023-05-18: 4 mg via INTRAVENOUS
  Filled 2023-05-18: qty 2

## 2023-05-18 NOTE — ED Provider Notes (Incomplete)
[place] EMERGENCY DEPARTMENT AT [facility] Provider Note   CSN: [id number] Arrival date & time: 05/18/23  2200     History {Add pertinent medical, surgical, social history, OB history to HPI:1} Chief Complaint  Patient presents with  . Abdominal Pain    Samantha Buckley is a 23 y.o. female with no significant past medical history presents the ED today for abdominal pain.  Patient reports intermittent sharp pain to the lower abdomen that began this morning.  She states that the pain has increased in severity causing her to throw up.  At the time of evaluation she denies any pain but endorses nausea.  Denies fevers, dysuria, vaginal discharge, or changes to bowel habits.  Patient reports similar episodes in the past for which she came to the ED but a source of the pain was never found.  No additional complaints or concerns at this time.    Home Medications Prior to Admission medications   Medication Sig Start Date End Date Taking? Authorizing Provider  benzonatate (TESSALON) 100 MG capsule Take 1 capsule (100 mg total) by mouth every 8 (eight) hours. 03/29/23   Mannie Stabile, PA-C  dicyclomine (BENTYL) 20 MG tablet Take 1 tablet (20 mg total) by mouth 3 (three) times daily as needed (Abdominal pain). 06/06/21   Rosezella Rumpf, PA-C  ondansetron (ZOFRAN-ODT) 4 MG disintegrating tablet Take 1 tablet (4 mg total) by mouth every 8 (eight) hours as needed for nausea or vomiting. 03/29/23   Mannie Stabile, PA-C  ondansetron (ZOFRAN-ODT) 8 MG disintegrating tablet Take 1 tablet (8 mg total) by mouth every 8 (eight) hours as needed for nausea or vomiting. 10/29/21   Molpus, John, MD  pantoprazole (PROTONIX) 20 MG tablet Take 1 tablet (20 mg total) by mouth daily. 06/04/21   Terrilee Files, MD  promethazine-dextromethorphan (PROMETHAZINE-DM) 6.25-15 MG/5ML syrup Take 5 mLs by mouth 4 (four) times daily as needed for cough. 03/09/23   Judithann Sheen, PA      Allergies     Penicillins    Review of Systems   Review of Systems  Gastrointestinal:  Positive for abdominal pain.  All other systems reviewed and are negative.   Physical Exam Updated Vital Signs BP (!) 144/89 (BP Location: Right Arm)   Pulse 77   Temp 98.6 F (37 C)   Resp 18   Ht 5\' 6"  (1.676 m)   Wt 108.9 kg   SpO2 98%   BMI 38.74 kg/m  Physical Exam Vitals and nursing note reviewed.  Constitutional:      General: She is not in acute distress.    Appearance: Normal appearance.  HENT:     Head: Normocephalic and atraumatic.     Mouth/Throat:     Mouth: Mucous membranes are moist.  Eyes:     Conjunctiva/sclera: Conjunctivae normal.     Pupils: Pupils are equal, round, and reactive to light.  Cardiovascular:     Rate and Rhythm: Normal rate and regular rhythm.     Pulses: Normal pulses.     Heart sounds: Normal heart sounds.  Pulmonary:     Effort: Pulmonary effort is normal.     Breath sounds: Normal breath sounds.  Abdominal:     Palpations: Abdomen is soft.     Tenderness: There is abdominal tenderness. There is no right CVA tenderness or left CVA tenderness.     Comments: Tenderness to palpation of the lower abdomen at the suprapubic area  Skin:  General: Skin is warm and dry.     Findings: No rash.  Neurological:     General: No focal deficit present.     Mental Status: She is alert.  Psychiatric:        Mood and Affect: Mood normal.        Behavior: Behavior normal.    ED Results / Procedures / Treatments   Labs (all labs ordered are listed, but only abnormal results are displayed) Labs Reviewed  LIPASE, BLOOD  COMPREHENSIVE METABOLIC PANEL  CBC  URINALYSIS, ROUTINE W REFLEX MICROSCOPIC  PREGNANCY, URINE    EKG None  Radiology No results found.  Procedures Procedures: not indicated. {Document cardiac monitor, telemetry assessment procedure when appropriate:1}  Medications Ordered in ED Medications  ondansetron (ZOFRAN) injection 4 mg (has no  administration in time range)    ED Course/ Medical Decision Making/ A&P   {   Click here for ABCD2, HEART and other calculatorsREFRESH Note before signing :1}                              Medical Decision Making Amount and/or Complexity of Data Reviewed Labs: ordered. Radiology: ordered.  Risk Prescription drug management.   This patient presents to the ED for concern of abdominal pain, this involves an extensive number of treatment options, and is a complaint that carries with it a high risk of complications and morbidity.   Differential diagnosis includes: UTI, pyelonephritis, gastroenteritis, gastritis, IBS, IBD, IUP, ectopic pregnancy, bowel obstruction, perforation, volvulus, etc.   Comorbidities  No significant past medical history   Additional History  Additional history obtained from prior records   Lab Tests  I ordered and personally interpreted labs.  The pertinent results include:   CMP, lipase, and CBC are unremarkable - no acute AKI, electrolyte derangement, infection, or anemia    Imaging Studies  I ordered imaging studies including CT abdomen/pelvis I independently visualized and interpreted imaging which showed: *** I agree with the radiologist interpretation   Problem List / ED Course / Critical Interventions / Medication Management  Intermittent lower abdominal pain since this morning I ordered medications including: Zofran for nausea Bentyl for abdominal pain Reevaluation of the patient after these medicines showed that the patient improved I have reviewed the patients home medicines and have made adjustments as needed   Social Determinants of Health  Access to healthcare   Test / Admission - Considered  ***   {Document critical care time when appropriate:1} {Document review of labs and clinical decision tools ie heart score, Chads2Vasc2 etc:1}  {Document your independent review of radiology images, and any outside  records:1} {Document your discussion with family members, caretakers, and with consultants:1} {Document social determinants of health affecting pt's care:1} {Document your decision making why or why not admission, treatments were needed:1} Final Clinical Impression(s) / ED Diagnoses Final diagnoses:  None    Rx / DC Orders ED Discharge Orders     None

## 2023-05-18 NOTE — ED Provider Notes (Signed)
[place] [facility] AT [facility] Provider Note   CSN: [id number] Arrival date & time: 05/18/23  2200     History  Chief Complaint  Patient presents with   Abdominal Pain    Samantha Buckley is a 23 y.o. female with no significant past medical history presents the ED today for abdominal pain.  Patient reports intermittent sharp pain to the lower abdomen that began this morning.  She states that the pain has increased in severity causing her to throw up.  At the time of evaluation she denies any pain but endorses nausea.  Denies fevers, dysuria, vaginal discharge, or changes to bowel habits.  Patient reports similar episodes in the past for which she came to the ED but a source of the pain was never found.  No additional complaints or concerns at this time.    Home Medications Prior to Admission medications   Medication Sig Start Date End Date Taking? Authorizing Provider  dicyclomine (BENTYL) 20 MG tablet Take 1 tablet (20 mg total) by mouth 2 (two) times daily for 14 days. 05/19/23 06/02/23 Yes Maxwell Marion, PA-C  ondansetron (ZOFRAN-ODT) 4 MG disintegrating tablet Take 1 tablet (4 mg total) by mouth every 8 (eight) hours as needed for up to 14 days for nausea or vomiting. 05/19/23 06/02/23 Yes Maxwell Marion, PA-C  benzonatate (TESSALON) 100 MG capsule Take 1 capsule (100 mg total) by mouth every 8 (eight) hours. 03/29/23   Mannie Stabile, PA-C  pantoprazole (PROTONIX) 20 MG tablet Take 1 tablet (20 mg total) by mouth daily. 06/04/21   Terrilee Files, MD  promethazine-dextromethorphan (PROMETHAZINE-DM) 6.25-15 MG/5ML syrup Take 5 mLs by mouth 4 (four) times daily as needed for cough. 03/09/23   Judithann Sheen, PA      Allergies    Penicillins    Review of Systems   Review of Systems  Gastrointestinal:  Positive for abdominal pain.  All other systems reviewed and are negative.   Physical Exam Updated Vital Signs BP (!) 144/89 (BP Location: Right Arm)    Pulse 77   Temp 98.6 F (37 C)   Resp 18   Ht 5\' 6"  (1.676 m)   Wt 108.9 kg   SpO2 98%   BMI 38.74 kg/m  Physical Exam Vitals and nursing note reviewed.  Constitutional:      General: She is not in acute distress.    Appearance: Normal appearance.  HENT:     Head: Normocephalic and atraumatic.     Mouth/Throat:     Mouth: Mucous membranes are moist.  Eyes:     Conjunctiva/sclera: Conjunctivae normal.     Pupils: Pupils are equal, round, and reactive to light.  Cardiovascular:     Rate and Rhythm: Normal rate and regular rhythm.     Pulses: Normal pulses.     Heart sounds: Normal heart sounds.  Pulmonary:     Effort: Pulmonary effort is normal.     Breath sounds: Normal breath sounds.  Abdominal:     Palpations: Abdomen is soft.     Tenderness: There is abdominal tenderness. There is no right CVA tenderness or left CVA tenderness.     Comments: Tenderness to palpation of the lower abdomen at the suprapubic area  Skin:    General: Skin is warm and dry.     Findings: No rash.  Neurological:     General: No focal deficit present.     Mental Status: She is alert.  Psychiatric:  Mood and Affect: Mood normal.        Behavior: Behavior normal.    ED Results / Procedures / Treatments   Labs (all labs ordered are listed, but only abnormal results are displayed) Labs Reviewed  COMPREHENSIVE METABOLIC PANEL - Abnormal; Notable for the following components:      Result Value   CO2 21 (*)    All other components within normal limits  URINALYSIS, ROUTINE W REFLEX MICROSCOPIC - Abnormal; Notable for the following components:   Hgb urine dipstick MODERATE (*)    All other components within normal limits  URINALYSIS, MICROSCOPIC (REFLEX) - Abnormal; Notable for the following components:   Bacteria, UA MANY (*)    All other components within normal limits  URINE CULTURE  LIPASE, BLOOD  CBC  HCG, SERUM, QUALITATIVE    EKG None  Radiology CT ABDOMEN PELVIS W  CONTRAST Result Date: 05/19/2023 CLINICAL DATA:  Acute nonlocalized abdominal pain, vomiting EXAM: CT ABDOMEN AND PELVIS WITH CONTRAST TECHNIQUE: Multidetector CT imaging of the abdomen and pelvis was performed using the standard protocol following bolus administration of intravenous contrast. RADIATION DOSE REDUCTION: This exam was performed according to the departmental dose-optimization program which includes automated exposure control, adjustment of the mA and/or kV according to patient size and/or use of iterative reconstruction technique. CONTRAST:  OMNIPAQUE IOHEXOL 300 MG/ML  SOLN COMPARISON:  06/04/2021 FINDINGS: Lower chest: No acute abnormality. Hepatobiliary: No focal liver abnormality is seen. No gallstones, gallbladder wall thickening, or biliary dilatation. Pancreas: Unremarkable Spleen: Unremarkable Adrenals/Urinary Tract: Adrenal glands are unremarkable. Kidneys are normal, without renal calculi, focal lesion, or hydronephrosis. Bladder is unremarkable. Stomach/Bowel: Stomach is within normal limits. Appendix appears normal. No evidence of bowel wall thickening, distention, or inflammatory changes. Vascular/Lymphatic: No significant vascular findings are present. No enlarged abdominal or pelvic lymph nodes. Reproductive: Uterus and bilateral adnexa are unremarkable. Other: No abdominal wall hernia or abnormality. No abdominopelvic ascites. Musculoskeletal: No acute or significant osseous findings. IMPRESSION: No acute intra-abdominal pathology identified. No definite radiographic explanation for the patient's reported symptoms. Electronically Signed   By: Helyn Numbers M.D.   On: 05/19/2023 00:28    Procedures Procedures: not indicated.   Medications Ordered in ED Medications  ondansetron (ZOFRAN) injection 4 mg (4 mg Intravenous Given 05/18/23 2238)  iohexol (OMNIPAQUE) 300 MG/ML solution 125 mL (125 mLs Intravenous Contrast Given 05/19/23 0014)  dicyclomine (BENTYL) injection 20  mg (20 mg Intramuscular Given 05/19/23 0031)    ED Course/ Medical Decision Making/ A&P                                 Medical Decision Making Amount and/or Complexity of Data Reviewed Labs: ordered. Radiology: ordered.  Risk Prescription drug management.   This patient presents to the ED for concern of abdominal pain, this involves an extensive number of treatment options, and is a complaint that carries with it a high risk of complications and morbidity.   Differential diagnosis includes: UTI, pyelonephritis, gastroenteritis, gastritis, IBS, IBD, IUP, ectopic pregnancy, bowel obstruction, perforation, volvulus, etc.   Comorbidities  No significant past medical history   Additional History  Additional history obtained from prior records   Lab Tests  I ordered and personally interpreted labs.  The pertinent results include:   CMP, lipase, and CBC are unremarkable - no acute AKI, electrolyte derangement, infection, or anemia Negative pregnancy test UA showed many bacteria, moderate hemoglobin, 11-20 squamous epithelial  cells.  Culture pending.   Imaging Studies  I ordered imaging studies including CT abdomen/pelvis I independently visualized and interpreted imaging which showed: No acute intra-abdominal pathology notified. I agree with the radiologist interpretation   Problem List / ED Course / Critical Interventions / Medication Management  Intermittent lower abdominal pain since this morning I ordered medications including: Zofran for nausea Bentyl for abdominal pain Reevaluation of the patient after these medicines showed that the patient improved. I have reviewed the patients home medicines and have made adjustments as needed   Social Determinants of Health  Access to healthcare   Test / Admission - Considered  Discussed signs with patient.  All questions answered. She is hemodynamically stable and safe for discharge home. Return precautions  given.       Final Clinical Impression(s) / ED Diagnoses Final diagnoses:  Lower abdominal pain    Rx / DC Orders ED Discharge Orders          Ordered    dicyclomine (BENTYL) 20 MG tablet  2 times daily        05/19/23 0007    ondansetron (ZOFRAN-ODT) 4 MG disintegrating tablet  Every 8 hours PRN        05/19/23 0007              Maxwell Marion, PA-C 05/19/23 0031    Coral Spikes, DO 05/20/23 0018

## 2023-05-18 NOTE — ED Triage Notes (Signed)
Pt states she has had lower abd pain all day but two hours ago she began vomiting and has had several episodes now. Denies urinary symptoms . Denies pain at any other location.

## 2023-05-19 LAB — URINALYSIS, ROUTINE W REFLEX MICROSCOPIC
Bilirubin Urine: NEGATIVE
Glucose, UA: NEGATIVE mg/dL
Ketones, ur: NEGATIVE mg/dL
Leukocytes,Ua: NEGATIVE
Nitrite: NEGATIVE
Protein, ur: NEGATIVE mg/dL
Specific Gravity, Urine: >=1.03 (ref 1.005–1.030)
pH: 5.5 (ref 5.0–8.0)

## 2023-05-19 LAB — HCG, SERUM, QUALITATIVE: Preg, Serum: NEGATIVE

## 2023-05-19 LAB — URINALYSIS, MICROSCOPIC (REFLEX)

## 2023-05-19 MED ORDER — DICYCLOMINE HCL 10 MG/ML IM SOLN
20.0000 mg | Freq: Once | INTRAMUSCULAR | Status: AC
  Administered 2023-05-19: 20 mg via INTRAMUSCULAR
  Filled 2023-05-19: qty 2

## 2023-05-19 MED ORDER — DICYCLOMINE HCL 20 MG PO TABS: 20.0000 mg | ORAL_TABLET | Freq: Two times a day (BID) | ORAL | 0 refills | Status: DC

## 2023-05-19 MED ORDER — ONDANSETRON 4 MG PO TBDP: 4.0000 mg | ORAL_TABLET | Freq: Three times a day (TID) | ORAL | 0 refills | Status: AC | PRN

## 2023-05-19 NOTE — Discharge Instructions (Addendum)
As discussed, your labs and imaging are unremarkable.  Take Bentyl twice a day as needed for abdominal pain.  Take Zofran every 8 hours as needed for nausea/vomiting.  I have provided information for gastroenterology you can follow-up with further evaluation if symptoms persist.  Get help right away if: You cannot stop vomiting. Your pain is only in one part of your belly, like on the right side. You have bloody or black poop, or poop that looks like tar. You have trouble breathing. You have chest pain.

## 2023-06-05 ENCOUNTER — Other Ambulatory Visit: Payer: Self-pay

## 2023-06-05 ENCOUNTER — Emergency Department (HOSPITAL_BASED_OUTPATIENT_CLINIC_OR_DEPARTMENT_OTHER): Payer: No Typology Code available for payment source

## 2023-06-05 ENCOUNTER — Emergency Department (HOSPITAL_BASED_OUTPATIENT_CLINIC_OR_DEPARTMENT_OTHER)
Admission: EM | Admit: 2023-06-05 | Discharge: 2023-06-05 | Disposition: A | Discharge status: Home / Self Care | Payer: No Typology Code available for payment source | Attending: Emergency Medicine | Admitting: Emergency Medicine

## 2023-06-05 ENCOUNTER — Encounter (HOSPITAL_BASED_OUTPATIENT_CLINIC_OR_DEPARTMENT_OTHER): Payer: Self-pay | Admitting: Emergency Medicine

## 2023-06-05 DIAGNOSIS — Z79899 Other long term (current) drug therapy: Secondary | ICD-10-CM | POA: Insufficient documentation

## 2023-06-05 DIAGNOSIS — R0789 Other chest pain: Secondary | ICD-10-CM | POA: Diagnosis not present

## 2023-06-05 DIAGNOSIS — R002 Palpitations: Secondary | ICD-10-CM | POA: Diagnosis present

## 2023-06-05 DIAGNOSIS — R42 Dizziness and giddiness: Secondary | ICD-10-CM | POA: Insufficient documentation

## 2023-06-05 DIAGNOSIS — Z20822 Contact with and (suspected) exposure to covid-19: Secondary | ICD-10-CM | POA: Insufficient documentation

## 2023-06-05 HISTORY — DX: Anxiety disorder, unspecified: F41.9

## 2023-06-05 LAB — BASIC METABOLIC PANEL
Anion gap: 6 (ref 5–15)
BUN: 13 mg/dL (ref 6–20)
CO2: 25 mmol/L (ref 22–32)
Calcium: 9.4 mg/dL (ref 8.9–10.3)
Chloride: 103 mmol/L (ref 98–111)
Creatinine, Ser: 0.74 mg/dL (ref 0.44–1.00)
GFR, Estimated: >60 mL/min (ref >60)
Glucose, Bld: 100 mg/dL — ABNORMAL HIGH (ref 70–99)
Potassium: 4 mmol/L (ref 3.5–5.1)
Sodium: 134 mmol/L — ABNORMAL LOW (ref 135–145)

## 2023-06-05 LAB — RESP PANEL BY RT-PCR (RSV, FLU A&B, COVID)  RVPGX2
Influenza A by PCR: NEGATIVE
Influenza B by PCR: NEGATIVE
Resp Syncytial Virus by PCR: NEGATIVE
SARS Coronavirus 2 by RT PCR: NEGATIVE

## 2023-06-05 LAB — CBC
HCT: 40.4 % (ref 36.0–46.0)
Hemoglobin: 12.8 g/dL (ref 12.0–15.0)
MCH: 26.4 pg (ref 26.0–34.0)
MCHC: 31.7 g/dL (ref 30.0–36.0)
MCV: 83.3 fL (ref 80.0–100.0)
Platelets: 331 10*3/uL (ref 150–400)
RBC: 4.85 MIL/uL (ref 3.87–5.11)
RDW: 13.6 % (ref 11.5–15.5)
WBC: 8.6 10*3/uL (ref 4.0–10.5)
nRBC: 0 % (ref 0.0–0.2)

## 2023-06-05 LAB — PREGNANCY, URINE: Preg Test, Ur: NEGATIVE

## 2023-06-05 LAB — TROPONIN I (HIGH SENSITIVITY): Troponin I (High Sensitivity): <2 ng/L (ref <18)

## 2023-06-05 LAB — TSH: TSH: 1.365 u[IU]/mL (ref 0.350–4.500)

## 2023-06-05 MED ORDER — LACTATED RINGERS IV BOLUS
1000.0000 mL | Freq: Once | INTRAVENOUS | Status: AC
  Administered 2023-06-05: 1000 mL via INTRAVENOUS

## 2023-06-05 NOTE — ED Notes (Signed)
 Pt advised she has not had an episode of palpitations since being in the lobby. No current complaint.

## 2023-06-05 NOTE — Discharge Instructions (Addendum)
 Your workup was reassuring.  No concerning cause of your palpitation or chest pain.  Follow-up with your primary care doctor.  Clinic listed above.  Have also given you cardiology referral.  For any concerning symptoms return to the emergency room.  Cut back on soft drinks and caffeine.  Drink plenty of water.

## 2023-06-05 NOTE — ED Triage Notes (Signed)
 Pt POV palpitations x3 days, feeling faint at work today. Reports increased stress lately. CP, ShOB immediately prior and post palpitation episodes. Denies at this time. Worse with movement.

## 2023-06-05 NOTE — ED Notes (Signed)
 Discharge paperwork reviewed entirely with patient, including follow up care. Pain was under control. No prescriptions were called in, but all questions were addressed.  Pt verbalized understanding as well as all parties involved. No questions or concerns voiced at the time of discharge. No acute distress noted.   Pt ambulated out to PVA without incident or assistance.  Pt advised they will seek followup care with a specialist and followup with their PCP.

## 2023-06-05 NOTE — ED Provider Notes (Signed)
 [place] [facility] Provider Note   CSN: [id number] Arrival date & time: 06/05/23  1401     History  Chief Complaint  Patient presents with   Palpitations    Samantha Buckley is a 23 y.o. female.  23 year old female presents today for concern of palpitations, chest pain.  Ongoing for the past 3 days.  Denies significant caffeine intake.  Does drink about 1 soft drink every 2 to 3 days.  She states she drinks about a gallon of water per day.  She states she is having some chest discomfort which is intermittent.  Most recently while she was out in the lobby.  She states she had to leave work early today due to her symptoms.  She became lightheaded.  Denies any significant past medical history.  No previous history of heart disease.  The history is provided by the patient. No language interpreter was used.       Home Medications Prior to Admission medications   Medication Sig Start Date End Date Taking? Authorizing Provider  benzonatate  (TESSALON ) 100 MG capsule Take 1 capsule (100 mg total) by mouth every 8 (eight) hours. 03/29/23   Aberman, Caroline C, PA-C  dicyclomine  (BENTYL ) 20 MG tablet Take 1 tablet (20 mg total) by mouth 2 (two) times daily for 14 days. 05/19/23 06/02/23  Waddell Sluder, PA-C  pantoprazole  (PROTONIX ) 20 MG tablet Take 1 tablet (20 mg total) by mouth daily. 06/04/21   Towana Ozell BROCKS, MD  promethazine -dextromethorphan (PROMETHAZINE -DM) 6.25-15 MG/5ML syrup Take 5 mLs by mouth 4 (four) times daily as needed for cough. 03/09/23   Minnie Tinnie BRAVO, PA      Allergies    Penicillins    Review of Systems   Review of Systems  Constitutional:  Negative for chills and fever.  Respiratory:  Negative for cough and shortness of breath.   Cardiovascular:  Positive for chest pain and palpitations. Negative for leg swelling.  Neurological:  Negative for light-headedness.  All other systems reviewed and are negative.   Physical  Exam Updated Vital Signs BP (!) 137/93   Pulse 91   Temp 98.6 F (37 C) (Oral)   Resp 15   Ht 5' 6 (1.676 m)   LMP 05/04/2023 (Approximate)   SpO2 100%   BMI 38.74 kg/m  Physical Exam Vitals and nursing note reviewed.  Constitutional:      General: She is not in acute distress.    Appearance: Normal appearance. She is not ill-appearing.  HENT:     Head: Normocephalic and atraumatic.     Nose: Nose normal.  Eyes:     General: No scleral icterus.    Extraocular Movements: Extraocular movements intact.     Conjunctiva/sclera: Conjunctivae normal.  Cardiovascular:     Rate and Rhythm: Normal rate and regular rhythm.     Heart sounds: Normal heart sounds.  Pulmonary:     Effort: Pulmonary effort is normal. No respiratory distress.     Breath sounds: Normal breath sounds. No wheezing or rales.  Musculoskeletal:        General: Normal range of motion.     Cervical back: Normal range of motion.     Right lower leg: No edema.     Left lower leg: No edema.  Skin:    General: Skin is warm and dry.  Neurological:     General: No focal deficit present.     Mental Status: She is alert. Mental status is at baseline.  ED Results / Procedures / Treatments   Labs (all labs ordered are listed, but only abnormal results are displayed) Labs Reviewed  BASIC METABOLIC PANEL - Abnormal; Notable for the following components:      Result Value   Sodium 134 (*)    Glucose, Bld 100 (*)    All other components within normal limits  CBC  PREGNANCY, URINE  TSH  TROPONIN I (HIGH SENSITIVITY)    EKG None  Radiology No results found.  Procedures Procedures    Medications Ordered in ED Medications  lactated ringers  bolus 1,000 mL (has no administration in time range)    ED Course/ Medical Decision Making/ A&P                                 Medical Decision Making Amount and/or Complexity of Data Reviewed Labs: ordered.   Medical Decision Making / ED  Course   This patient presents to the ED for concern of palpitations, shortness of breath, this involves an extensive number of treatment options, and is a complaint that carries with it a high risk of complications and morbidity.  The differential diagnosis includes ACS, PE, pneumonia, dehydration, PVCs, A-fib  MDM: 23 year old female presents today for concern of palpitations and lightheadedness along with some chest discomfort.  She had to leave work early today for the symptoms.  Currently asymptomatic.  Will obtain troponin, TSH, provide fluid bolus and obtain respiratory panel.  CBC BMP otherwise reassuring.  Telemetry without evidence of concerning rhythm.  Without chest pain during the ED stay.  Troponin negative.  Respiratory panel negative.  Chest x-ray without acute cardiopulmonary process.  EKG without acute ischemic change.  Likely related to dehydration.  Will give cardiology referral for palpitations.  Will give PCP referral.  She will follow-up on the TSH result on MyChart.  Patient is stable for discharge.  Discharged in stable condition.  Return precaution discussed.  Patient voices understanding and is in agreement with plan.   Lab Tests: -I ordered, reviewed, and interpreted labs.   The pertinent results include:   Labs Reviewed  BASIC METABOLIC PANEL - Abnormal; Notable for the following components:      Result Value   Sodium 134 (*)    Glucose, Bld 100 (*)    All other components within normal limits  RESP PANEL BY RT-PCR (RSV, FLU A&B, COVID)  RVPGX2  CBC  PREGNANCY, URINE  TSH  TROPONIN I (HIGH SENSITIVITY)      EKG  EKG Interpretation Date/Time:    Ventricular Rate:    PR Interval:    QRS Duration:    QT Interval:    QTC Calculation:   R Axis:      Text Interpretation:           Imaging Studies ordered: I ordered imaging studies including chest x-ray I independently visualized and interpreted imaging. I agree with the radiologist  interpretation   Medicines ordered and prescription drug management: Meds ordered this encounter  Medications   lactated ringers  bolus 1,000 mL    -I have reviewed the patients home medicines and have made adjustments as needed   Cardiac Monitoring: The patient was maintained on a cardiac monitor.  I personally viewed and interpreted the cardiac monitored which showed an underlying rhythm of: Normal sinus rhythm  Social Determinants of Health:  Factors impacting patients care include: NO PCP   Reevaluation: After the interventions noted above,  I reevaluated the patient and found that they have :improved remained without concern throughout the ED stay.  No chest pain.  Co morbidities that complicate the patient evaluation  Past Medical History:  Diagnosis Date   Anxiety       Dispostion: Discharged in stable condition.  Return precaution discussed.  No prior history of A-fib.  Will give cardiology referral for palpitation.  Discussed decreasing caffeine.   Final Clinical Impression(s) / ED Diagnoses Final diagnoses:  Palpitations    Rx / DC Orders ED Discharge Orders          Ordered    Ambulatory referral to Cardiology       Comments: If you have not heard from the Cardiology office within the next 72 hours please call [phone].   06/05/23 1744              Hildegard Loge, PA-C 06/05/23 1744    Geraldene Hamilton, MD 06/05/23 [phone]

## 2023-08-05 ENCOUNTER — Other Ambulatory Visit: Payer: Self-pay

## 2023-08-05 DIAGNOSIS — R002 Palpitations: Secondary | ICD-10-CM | POA: Insufficient documentation

## 2023-08-05 DIAGNOSIS — F419 Anxiety disorder, unspecified: Secondary | ICD-10-CM | POA: Insufficient documentation

## 2023-08-06 ENCOUNTER — Ambulatory Visit: Payer: No Typology Code available for payment source | Attending: Cardiology | Admitting: Cardiology

## 2023-11-02 ENCOUNTER — Emergency Department (HOSPITAL_BASED_OUTPATIENT_CLINIC_OR_DEPARTMENT_OTHER): Payer: Worker's Compensation

## 2023-11-02 ENCOUNTER — Other Ambulatory Visit: Payer: Self-pay

## 2023-11-02 ENCOUNTER — Encounter (HOSPITAL_BASED_OUTPATIENT_CLINIC_OR_DEPARTMENT_OTHER): Payer: Self-pay | Admitting: Emergency Medicine

## 2023-11-02 ENCOUNTER — Emergency Department (HOSPITAL_BASED_OUTPATIENT_CLINIC_OR_DEPARTMENT_OTHER)
Admission: EM | Admit: 2023-11-02 | Discharge: 2023-11-02 | Disposition: A | Discharge status: Home / Self Care | Payer: Worker's Compensation | Attending: Emergency Medicine | Admitting: Emergency Medicine

## 2023-11-02 DIAGNOSIS — M79641 Pain in right hand: Secondary | ICD-10-CM | POA: Diagnosis present

## 2023-11-02 DIAGNOSIS — R600 Localized edema: Secondary | ICD-10-CM | POA: Insufficient documentation

## 2023-11-02 DIAGNOSIS — W278XXA Contact with other nonpowered hand tool, initial encounter: Secondary | ICD-10-CM | POA: Insufficient documentation

## 2023-11-02 DIAGNOSIS — Y990 Civilian activity done for income or pay: Secondary | ICD-10-CM | POA: Diagnosis not present

## 2023-11-02 NOTE — ED Triage Notes (Signed)
 Left hand and lower arm injury while working on a tire.  Noted bruising to hand.  Pt c/o pain to hand and wrist/lower arm area.

## 2023-11-02 NOTE — ED Provider Notes (Signed)
 [place] [facility] Provider Note   CSN: [id number] Arrival date & time: 11/02/23  9075     Patient presents with: Arm Injury and Hand Injury   Samantha Buckley is a 23 y.o. female with no significant past medical history presents with concern for right hand pain after being hit by a tool at work and changing a tire.  Reporting pain to the dorsum of her right hand.  Reports mild numbness in her fingers, but no loss of sensation.    Arm Injury Hand Injury      Prior to Admission medications   Medication Sig Start Date End Date Taking? Authorizing Provider  benzonatate  (TESSALON ) 100 MG capsule Take 1 capsule (100 mg total) by mouth every 8 (eight) hours. 03/29/23   Lorelle Aleck BROCKS, PA-C  pantoprazole  (PROTONIX ) 20 MG tablet Take 1 tablet (20 mg total) by mouth daily. 06/04/21   Towana Ozell BROCKS, MD  promethazine -dextromethorphan (PROMETHAZINE -DM) 6.25-15 MG/5ML syrup Take 5 mLs by mouth 4 (four) times daily as needed for cough. 03/09/23   Minnie Tinnie BRAVO, PA    Allergies: Penicillins    Review of Systems  Musculoskeletal:        Hand pain    Updated Vital Signs BP (!) 142/98 (BP Location: Right Arm)   Pulse 84   Temp 98.2 F (36.8 C) (Oral)   Resp 16   Ht 5' 5 (1.651 m)   Wt 99.8 kg   LMP 11/02/2023   SpO2 100%   BMI 36.61 kg/m   Physical Exam Vitals and nursing note reviewed.  Constitutional:      Appearance: Normal appearance.  HENT:     Head: Atraumatic.  Cardiovascular:     Comments: 2+ radial pulse bilaterally Pulmonary:     Effort: Pulmonary effort is normal.  Musculoskeletal:     Comments: Right upper extremity:  General Mild edema over the dorsum of the hand overlying the 1st/2nd metacarpals. No erythema, contusions, open wounds. No obvious deformity. Compartments of the RUE soft  Palpation Tender diffusely of the 1st and 2nd metacarpals.  Nontender over the third, fourth, fifth metacarpals.  Nontender  over the 1st-5th phalanges Nontender of the carpal bones diffusely, no snuffbox TTP Non-tender of the radius or ulna  ROM Full flexion extension at the wrist Limited due to pain, but intact, flexion and extension at the 1st through 5th MCPs, PIPs, DIP joints  Sensation: Sensation intact throughout the 1st-5th digits   Neurological:     General: No focal deficit present.     Mental Status: She is alert.  Psychiatric:        Mood and Affect: Mood normal.        Behavior: Behavior normal.     (all labs ordered are listed, but only abnormal results are displayed) Labs Reviewed - No data to display  EKG: None  Radiology: DG Hand Complete Right Result Date: 11/02/2023 CLINICAL DATA:  Right hand pain and swelling after trauma. EXAM: RIGHT HAND - COMPLETE 3+ VIEW COMPARISON:  10/16/2021 thumb images FINDINGS: No fracture or malalignment. No acute bony findings. No foreign body observed. IMPRESSION: 1. No acute findings. Electronically Signed   By: Ryan Salvage M.D.   On: 11/02/2023 10:16     Procedures   Medications Ordered in the ED - No data to display  Medical Decision Making Amount and/or Complexity of Data Reviewed Radiology: ordered.     Differential diagnosis includes but is not limited to fracture, dislocation, sprain, nerve injury, vascular injury, contusion  ED Course:  Upon initial evaluation, patient is well appearing, no acute distress. Has pain and mild edema over the dorsal soft tissues of the hand overlying the 1st and 2nd metacarpal of the right hand. Full ROM of the RUE and neurovascularly intact. She declines any pain medicine.  Imaging Studies ordered: I ordered imaging studies including x-ray right hand  I independently visualized the imaging with scope of interpretation limited to determining acute life threatening conditions related to emergency care. Imaging showed no acute abnormalities I agree with the  radiologist interpretation   Medications Given: none  Upon re-evaluation, patient remains well.  Stable vitals.  Discussed that her x-ray does not show any signs of fracture or dislocation.  Suspect bone contusion and soft tissue edema is causing her pain.  No signs of tendon injury.  No signs of neurovascular compromise.  No concern for occult fracture at this time.  No concern for compartment syndrome.  Stable and appropriate for discharge home    Impression: Bone contusion Soft tissue edema  Disposition:  The patient was discharged home with instructions to elevate the hand to swelling.  May take Tylenol  and ibuprofen  as needed for pain.  Follow-up with orthopedics if symptoms not improved by next week.  Their contact information was provided. Return precautions given.   This chart was dictated using voice recognition software, Dragon. Despite the best efforts of this provider to proofread and correct errors, errors may still occur which can change documentation meaning.       Final diagnoses:  Right hand pain    ED Discharge Orders     None          Veta Palma, PA-C 11/02/23 1038    Lenor Hollering, MD 11/02/23 1329

## 2023-11-02 NOTE — ED Notes (Signed)

## 2023-11-02 NOTE — Discharge Instructions (Addendum)
 The hand x-ray did not show any signs of a fracture or dislocation.  You likely bruised the bone which is causing some pain.  You also have some swelling which can cause pain.  Avoid movements and activities that are painful in the first couple of days after the injury. Elevate the area of injury and ice the area to help with pain and swelling  You may take up to 1000mg  of tylenol  every 6 hours as needed for pain. Do not take more then 4g per day.   You may use up to 600mg  ibuprofen  every 6 hours as needed for pain.  Do not exceed 2.4g of ibuprofen  per day.   Gradually return to activity as pain allows. Try to engage in non-painful types of physical activity/exercise to increase blood flow to your area of injury.  Please follow-up with the orthopedic office listed below if your pain is not improving within the next week.  Return to the emergency room for any numbness in your hand, uncontrolled pain, any other new or concerning symptoms

## 2024-03-28 ENCOUNTER — Other Ambulatory Visit: Payer: Self-pay

## 2024-03-28 ENCOUNTER — Emergency Department (HOSPITAL_BASED_OUTPATIENT_CLINIC_OR_DEPARTMENT_OTHER): Admission: EM | Admit: 2024-03-28 | Discharge: 2024-03-28 | Disposition: A | Discharge status: Home / Self Care

## 2024-03-28 ENCOUNTER — Encounter (HOSPITAL_BASED_OUTPATIENT_CLINIC_OR_DEPARTMENT_OTHER): Payer: Self-pay | Admitting: Emergency Medicine

## 2024-03-28 DIAGNOSIS — J069 Acute upper respiratory infection, unspecified: Secondary | ICD-10-CM | POA: Insufficient documentation

## 2024-03-28 DIAGNOSIS — R059 Cough, unspecified: Secondary | ICD-10-CM | POA: Diagnosis present

## 2024-03-28 LAB — GROUP A STREP BY PCR: Group A Strep by PCR: NOT DETECTED

## 2024-03-28 LAB — RESP PANEL BY RT-PCR (RSV, FLU A&B, COVID)  RVPGX2
Influenza A by PCR: NEGATIVE
Influenza B by PCR: NEGATIVE
Resp Syncytial Virus by PCR: NEGATIVE
SARS Coronavirus 2 by RT PCR: NEGATIVE

## 2024-03-28 MED ORDER — KETOROLAC TROMETHAMINE 15 MG/ML IJ SOLN
15.0000 mg | Freq: Once | INTRAMUSCULAR | Status: AC
  Administered 2024-03-28: 15 mg via INTRAMUSCULAR
  Filled 2024-03-28: qty 1

## 2024-03-28 MED ORDER — BENZONATATE 100 MG PO CAPS: 100.0000 mg | ORAL_CAPSULE | Freq: Three times a day (TID) | ORAL | 0 refills | Status: AC

## 2024-03-28 MED ORDER — ONDANSETRON 4 MG PO TBDP: 4.0000 mg | ORAL_TABLET | Freq: Three times a day (TID) | ORAL | 0 refills | Status: AC | PRN

## 2024-03-28 MED ORDER — ONDANSETRON 4 MG PO TBDP
4.0000 mg | ORAL_TABLET | Freq: Once | ORAL | Status: AC
  Administered 2024-03-28: 4 mg via ORAL
  Filled 2024-03-28: qty 1

## 2024-03-28 NOTE — Discharge Instructions (Addendum)
 Your strep PCR was negative.  Your viral panel was negative for COVID, flu A, flu B, and RSV.  Please follow-up with your primary care provider in the next week for repeat exam.  Seek emergency care if experiencing any new or worsening symptoms.  Alternating between 650 mg Tylenol  and 400 mg Advil : The best way to alternate taking Acetaminophen  (example Tylenol ) and Ibuprofen  (example Advil /Motrin ) is to take them 3 hours apart. For example, if you take ibuprofen  at 6 am you can then take Tylenol  at 9 am. You can continue this regimen throughout the day, making sure you do not exceed the recommended maximum dose for each drug.

## 2024-03-28 NOTE — ED Triage Notes (Signed)
 Pt c/o cold like sx x 3 days. + fever.   Took tylenol  appx 0900 today.

## 2024-03-28 NOTE — ED Provider Notes (Signed)
 [facility] Provider Note   CSN: [id number] Arrival date & time: 03/28/24  1523     Patient presents with: URI   Samantha Buckley is a 23 y.o. female with non-contributory PMHx who presents to ED concerned for rhinorrhea, sore throat, cough, and fever over the past 3 days. Symptoms started the day after thanksgiving. Patient also nauseated. Also endorses a couple episode of post-tussive emesis. Patient has been taking tylenol  for her fever - last dose was around 9AM this morning. Patient stating that fever at home has gotten as high as 101F.   Denies chest pain, SOB, abdominal pain, diarrhea.    URI Presenting symptoms: cough        Prior to Admission medications   Medication Sig Start Date End Date Taking? Authorizing Provider  ondansetron  (ZOFRAN -ODT) 4 MG disintegrating tablet Take 1 tablet (4 mg total) by mouth every 8 (eight) hours as needed for nausea. 03/28/24  Yes Hoy Fraction F, PA-C  benzonatate  (TESSALON ) 100 MG capsule Take 1 capsule (100 mg total) by mouth every 8 (eight) hours. 03/28/24   Hoy Fraction FALCON, PA-C  pantoprazole  (PROTONIX ) 20 MG tablet Take 1 tablet (20 mg total) by mouth daily. 06/04/21   Towana Ozell BROCKS, MD  promethazine -dextromethorphan (PROMETHAZINE -DM) 6.25-15 MG/5ML syrup Take 5 mLs by mouth 4 (four) times daily as needed for cough. 03/09/23   Minnie Tinnie BRAVO, PA    Allergies: Penicillins    Review of Systems  Respiratory:  Positive for cough.     Updated Vital Signs BP (!) 142/89 (BP Location: Right Arm)   Pulse (!) 101   Temp 98.5 F (36.9 C)   Resp 20   Ht 5' 5 (1.651 m)   Wt 124.7 kg   LMP 03/27/2024 (Exact Date)   SpO2 100%   BMI 45.76 kg/m   Physical Exam Vitals and nursing note reviewed.  Constitutional:      General: She is not in acute distress.    Appearance: She is not ill-appearing or toxic-appearing.  HENT:     Head: Normocephalic and atraumatic.      Mouth/Throat:     Mouth: Mucous membranes are moist.     Pharynx: No oropharyngeal exudate or posterior oropharyngeal erythema.     Comments: No oropharyngeal exudate or erythema.  Tonsils +1 BL. Eyes:     General: No scleral icterus.       Right eye: No discharge.        Left eye: No discharge.     Conjunctiva/sclera: Conjunctivae normal.  Cardiovascular:     Rate and Rhythm: Normal rate and regular rhythm.     Pulses: Normal pulses.     Heart sounds: Normal heart sounds. No murmur heard. Pulmonary:     Effort: Pulmonary effort is normal. No respiratory distress.     Breath sounds: Normal breath sounds. No wheezing, rhonchi or rales.  Abdominal:     General: Abdomen is flat. Bowel sounds are normal. There is no distension.     Palpations: Abdomen is soft. There is no mass.     Tenderness: There is no abdominal tenderness.  Musculoskeletal:     Cervical back: Normal range of motion.     Right lower leg: No edema.     Left lower leg: No edema.  Skin:    General: Skin is warm and dry.     Findings: No rash.  Neurological:     General: No focal deficit present.  Mental Status: She is alert and oriented to person, place, and time. Mental status is at baseline.  Psychiatric:        Mood and Affect: Mood normal.        Behavior: Behavior normal.     (all labs ordered are listed, but only abnormal results are displayed) Labs Reviewed  GROUP A STREP BY PCR  RESP PANEL BY RT-PCR (RSV, FLU A&B, COVID)  [id number]    EKG: None  Radiology: No results found.   Procedures   Medications Ordered in the ED  ketorolac  (TORADOL ) 15 MG/ML injection 15 mg (has no administration in time range)  ondansetron  (ZOFRAN -ODT) disintegrating tablet 4 mg (has no administration in time range)                                    Medical Decision Making   This patient presents to the ED for concern of fever, rhinorrhea, sore throat, cough, this involves an extensive number of treatment  options, and is a complaint that carries with it a high risk of complications and morbidity.  The differential diagnosis includes Flu/COVID/RSV, strep pharyngitis, sinusitis, peritonsillar abscess, retropharyngeal abscess, pneumonia, meningitis.   Co morbidities that complicate the patient evaluation  None   Additional history obtained:  No PCP listed in chart   Problem List / ED Course / Critical interventions / Medication management  Patient presents ED concern for fever, rhinorrhea, sore throat, cough over the past 3 days.  Physical exam reassuring.  Patient mildly tachycardic at 101 bpm on initial vitals.  Rest of vital signs reassuring.  Patient afebrile with stable vitals. Patient stating that she is sure that she is not pregnant since she did start her period today. I Ordered, and personally interpreted labs.  Strep PCR negative.  Respiratory panel negative. Patient was educated on alternating between 650 mg Tylenol  and 400 mg ibuprofen  every 3 hours as needed for pain and fever.  Recommended following up with PCP.  Provided patient with a dose of Zofran  and Toradol  while in the ED.  Will prescribe Tessalon  Perles and Zofran  to help with her symptoms.  I have reviewed the patients home medicines and have made adjustments as needed The patient has been appropriately medically screened and/or stabilized in the ED. I have low suspicion for any other emergent medical condition which would require further screening, evaluation or treatment in the ED or require inpatient management. At time of discharge the patient is hemodynamically stable and in no acute distress. I have discussed work-up results and diagnosis with patient and answered all questions. Patient is agreeable with discharge plan. We discussed strict return precautions for returning to the emergency department and they verbalized understanding.      Social Determinants of Health:  none      Final diagnoses:  Viral URI  with cough    ED Discharge Orders          Ordered    ondansetron  (ZOFRAN -ODT) 4 MG disintegrating tablet  Every 8 hours PRN        03/28/24 1823    benzonatate  (TESSALON ) 100 MG capsule  Every 8 hours        03/28/24 1823               Hoy Nidia FALCON, [place] 03/28/24 1829    Ula Prentice SAUNDERS, MD 03/28/24 2337
